# Patient Record
Sex: Female | Born: 1935 | Race: White | Hispanic: No | Smoking: Never smoker
Health system: Southern US, Community
[De-identification: ages and names within clinical notes are randomized; demographics above are authoritative.]

## PROBLEM LIST (undated history)

## (undated) DIAGNOSIS — E119 Type 2 diabetes mellitus without complications: Secondary | ICD-10-CM

## (undated) DIAGNOSIS — B9681 Helicobacter pylori [H. pylori] as the cause of diseases classified elsewhere: Secondary | ICD-10-CM

## (undated) DIAGNOSIS — I701 Atherosclerosis of renal artery: Secondary | ICD-10-CM

## (undated) DIAGNOSIS — K760 Fatty (change of) liver, not elsewhere classified: Secondary | ICD-10-CM

## (undated) DIAGNOSIS — R0600 Dyspnea, unspecified: Secondary | ICD-10-CM

## (undated) DIAGNOSIS — M8580 Other specified disorders of bone density and structure, unspecified site: Secondary | ICD-10-CM

## (undated) DIAGNOSIS — K2970 Gastritis, unspecified, without bleeding: Secondary | ICD-10-CM

## (undated) DIAGNOSIS — J449 Chronic obstructive pulmonary disease, unspecified: Secondary | ICD-10-CM

## (undated) DIAGNOSIS — I519 Heart disease, unspecified: Secondary | ICD-10-CM

## (undated) DIAGNOSIS — Z860101 Personal history of adenomatous and serrated colon polyps: Secondary | ICD-10-CM

## (undated) DIAGNOSIS — K219 Gastro-esophageal reflux disease without esophagitis: Secondary | ICD-10-CM

## (undated) DIAGNOSIS — R600 Localized edema: Secondary | ICD-10-CM

## (undated) DIAGNOSIS — Z86010 Personal history of colonic polyps: Secondary | ICD-10-CM

## (undated) DIAGNOSIS — E039 Hypothyroidism, unspecified: Secondary | ICD-10-CM

## (undated) DIAGNOSIS — Z800 Family history of malignant neoplasm of digestive organs: Secondary | ICD-10-CM

## (undated) DIAGNOSIS — I10 Essential (primary) hypertension: Secondary | ICD-10-CM

## (undated) DIAGNOSIS — R918 Other nonspecific abnormal finding of lung field: Secondary | ICD-10-CM

## (undated) DIAGNOSIS — M1990 Unspecified osteoarthritis, unspecified site: Secondary | ICD-10-CM

## (undated) DIAGNOSIS — K5730 Diverticulosis of large intestine without perforation or abscess without bleeding: Secondary | ICD-10-CM

## (undated) DIAGNOSIS — E7800 Pure hypercholesterolemia, unspecified: Secondary | ICD-10-CM

## (undated) DIAGNOSIS — K834 Spasm of sphincter of Oddi: Secondary | ICD-10-CM

## (undated) DIAGNOSIS — K3184 Gastroparesis: Secondary | ICD-10-CM

## (undated) DIAGNOSIS — R079 Chest pain, unspecified: Secondary | ICD-10-CM

## (undated) DIAGNOSIS — K589 Irritable bowel syndrome without diarrhea: Secondary | ICD-10-CM

## (undated) DIAGNOSIS — E049 Nontoxic goiter, unspecified: Secondary | ICD-10-CM

## (undated) HISTORY — DX: Gastritis, unspecified, without bleeding: K29.70

## (undated) HISTORY — DX: Hypothyroidism, unspecified: E03.9

## (undated) HISTORY — DX: Irritable bowel syndrome, unspecified: K58.9

## (undated) HISTORY — DX: Nontoxic goiter, unspecified: E04.9

## (undated) HISTORY — DX: Gastroparesis: K31.84

## (undated) HISTORY — DX: Essential (primary) hypertension: I10

## (undated) HISTORY — DX: Dyspnea, unspecified: R06.00

## (undated) HISTORY — DX: Unspecified osteoarthritis, unspecified site: M19.90

## (undated) HISTORY — DX: Type 2 diabetes mellitus without complications: E11.9

## (undated) HISTORY — DX: Pure hypercholesterolemia, unspecified: E78.00

## (undated) HISTORY — DX: Helicobacter pylori (H. pylori) as the cause of diseases classified elsewhere: B96.81

## (undated) HISTORY — DX: Family history of malignant neoplasm of digestive organs: Z80.0

## (undated) HISTORY — PX: PARTIAL HYSTERECTOMY: SHX80

## (undated) HISTORY — DX: Chronic obstructive pulmonary disease, unspecified: J44.9

## (undated) HISTORY — DX: Localized edema: R60.0

## (undated) HISTORY — PX: HIP SURGERY: SHX245

## (undated) HISTORY — PX: ERCP W/ SPHINCTEROTOMY AND BALLOON DILATION: SHX1524

## (undated) HISTORY — PX: TUBAL LIGATION: SHX77

## (undated) HISTORY — PX: ABDOMINAL HERNIA REPAIR: SHX539

## (undated) HISTORY — DX: Atherosclerosis of renal artery: I70.1

## (undated) HISTORY — DX: Other nonspecific abnormal finding of lung field: R91.8

## (undated) HISTORY — DX: Chest pain, unspecified: R07.9

## (undated) HISTORY — DX: Fatty (change of) liver, not elsewhere classified: K76.0

## (undated) HISTORY — DX: Personal history of adenomatous and serrated colon polyps: Z86.0101

## (undated) HISTORY — DX: Diverticulosis of large intestine without perforation or abscess without bleeding: K57.30

## (undated) HISTORY — DX: Gastro-esophageal reflux disease without esophagitis: K21.9

## (undated) HISTORY — DX: Personal history of colonic polyps: Z86.010

## (undated) HISTORY — DX: Other specified disorders of bone density and structure, unspecified site: M85.80

## (undated) HISTORY — DX: Heart disease, unspecified: I51.9

## (undated) HISTORY — DX: Spasm of sphincter of Oddi: K83.4

## (undated) HISTORY — PX: CHOLECYSTECTOMY: SHX55

---

## 2001-03-29 ENCOUNTER — Ambulatory Visit (HOSPITAL_COMMUNITY): Admission: RE | Admit: 2001-03-29 | Discharge: 2001-03-29 | Payer: Self-pay | Admitting: Family Medicine

## 2001-03-29 ENCOUNTER — Encounter: Payer: Self-pay | Admitting: Family Medicine

## 2001-04-23 ENCOUNTER — Encounter: Payer: Self-pay | Admitting: General Surgery

## 2001-04-27 ENCOUNTER — Inpatient Hospital Stay (HOSPITAL_COMMUNITY): Admission: RE | Admit: 2001-04-27 | Discharge: 2001-05-02 | Payer: Self-pay | Admitting: General Surgery

## 2002-01-27 ENCOUNTER — Ambulatory Visit (HOSPITAL_COMMUNITY): Admission: RE | Admit: 2002-01-27 | Discharge: 2002-01-27 | Payer: Self-pay | Admitting: Internal Medicine

## 2002-02-24 ENCOUNTER — Encounter: Payer: Self-pay | Admitting: Family Medicine

## 2002-02-24 ENCOUNTER — Ambulatory Visit (HOSPITAL_COMMUNITY): Admission: RE | Admit: 2002-02-24 | Discharge: 2002-02-24 | Payer: Self-pay | Admitting: Family Medicine

## 2002-03-28 ENCOUNTER — Ambulatory Visit (HOSPITAL_COMMUNITY): Admission: RE | Admit: 2002-03-28 | Discharge: 2002-03-28 | Payer: Self-pay | Admitting: Cardiovascular Disease

## 2002-03-28 ENCOUNTER — Encounter: Payer: Self-pay | Admitting: Cardiovascular Disease

## 2002-11-18 ENCOUNTER — Encounter (INDEPENDENT_AMBULATORY_CARE_PROVIDER_SITE_OTHER): Payer: Self-pay | Admitting: Internal Medicine

## 2002-11-18 ENCOUNTER — Ambulatory Visit (HOSPITAL_COMMUNITY): Admission: RE | Admit: 2002-11-18 | Discharge: 2002-11-18 | Payer: Self-pay

## 2002-12-16 ENCOUNTER — Ambulatory Visit (HOSPITAL_COMMUNITY): Admission: RE | Admit: 2002-12-16 | Discharge: 2002-12-16 | Payer: Self-pay | Admitting: Internal Medicine

## 2002-12-16 ENCOUNTER — Encounter (INDEPENDENT_AMBULATORY_CARE_PROVIDER_SITE_OTHER): Payer: Self-pay | Admitting: Internal Medicine

## 2003-11-18 DIAGNOSIS — K3184 Gastroparesis: Secondary | ICD-10-CM

## 2003-11-18 HISTORY — DX: Gastroparesis: K31.84

## 2003-12-28 ENCOUNTER — Ambulatory Visit (HOSPITAL_COMMUNITY): Admission: RE | Admit: 2003-12-28 | Discharge: 2003-12-28 | Payer: Self-pay | Admitting: Family Medicine

## 2004-06-14 ENCOUNTER — Ambulatory Visit (HOSPITAL_COMMUNITY): Admission: RE | Admit: 2004-06-14 | Discharge: 2004-06-14 | Payer: Self-pay | Admitting: Internal Medicine

## 2004-07-18 ENCOUNTER — Ambulatory Visit (HOSPITAL_COMMUNITY): Admission: RE | Admit: 2004-07-18 | Discharge: 2004-07-18 | Payer: Self-pay | Admitting: Internal Medicine

## 2004-08-29 ENCOUNTER — Ambulatory Visit (HOSPITAL_COMMUNITY): Admission: RE | Admit: 2004-08-29 | Discharge: 2004-08-29 | Payer: Self-pay | Admitting: Internal Medicine

## 2004-09-04 ENCOUNTER — Ambulatory Visit (HOSPITAL_COMMUNITY): Admission: RE | Admit: 2004-09-04 | Discharge: 2004-09-04 | Payer: Self-pay | Admitting: Internal Medicine

## 2004-10-14 ENCOUNTER — Ambulatory Visit: Payer: Self-pay | Admitting: Internal Medicine

## 2004-11-10 ENCOUNTER — Emergency Department (HOSPITAL_COMMUNITY): Admission: EM | Admit: 2004-11-10 | Discharge: 2004-11-10 | Payer: Self-pay | Admitting: Emergency Medicine

## 2004-11-14 ENCOUNTER — Ambulatory Visit: Payer: Self-pay | Admitting: Internal Medicine

## 2004-11-26 ENCOUNTER — Ambulatory Visit (HOSPITAL_COMMUNITY): Admission: RE | Admit: 2004-11-26 | Discharge: 2004-11-26 | Payer: Self-pay | Admitting: Internal Medicine

## 2004-12-12 ENCOUNTER — Ambulatory Visit: Payer: Self-pay | Admitting: Internal Medicine

## 2005-01-27 ENCOUNTER — Ambulatory Visit: Payer: Self-pay | Admitting: Internal Medicine

## 2005-01-27 ENCOUNTER — Ambulatory Visit (HOSPITAL_COMMUNITY): Admission: RE | Admit: 2005-01-27 | Discharge: 2005-01-27 | Payer: Self-pay | Admitting: Internal Medicine

## 2005-05-27 ENCOUNTER — Ambulatory Visit (HOSPITAL_COMMUNITY): Admission: RE | Admit: 2005-05-27 | Discharge: 2005-05-27 | Payer: Self-pay | Admitting: General Surgery

## 2006-06-19 ENCOUNTER — Ambulatory Visit (HOSPITAL_COMMUNITY): Admission: RE | Admit: 2006-06-19 | Discharge: 2006-06-19 | Payer: Self-pay | Admitting: Pediatrics

## 2006-09-25 ENCOUNTER — Ambulatory Visit: Payer: Self-pay | Admitting: Gastroenterology

## 2006-09-28 ENCOUNTER — Ambulatory Visit (HOSPITAL_COMMUNITY): Admission: RE | Admit: 2006-09-28 | Discharge: 2006-09-28 | Payer: Self-pay | Admitting: Internal Medicine

## 2006-10-21 ENCOUNTER — Ambulatory Visit: Payer: Self-pay | Admitting: Internal Medicine

## 2007-05-25 ENCOUNTER — Encounter: Admission: RE | Admit: 2007-05-25 | Discharge: 2007-05-25 | Payer: Self-pay | Admitting: Cardiovascular Disease

## 2007-05-28 HISTORY — PX: CARDIAC CATHETERIZATION: SHX172

## 2007-09-21 ENCOUNTER — Ambulatory Visit: Payer: Self-pay | Admitting: Specialist

## 2007-10-26 ENCOUNTER — Ambulatory Visit (HOSPITAL_COMMUNITY): Admission: RE | Admit: 2007-10-26 | Discharge: 2007-10-26 | Payer: Self-pay | Admitting: Endocrinology

## 2008-04-27 ENCOUNTER — Ambulatory Visit: Payer: Self-pay | Admitting: Internal Medicine

## 2008-05-01 ENCOUNTER — Ambulatory Visit (HOSPITAL_COMMUNITY): Admission: RE | Admit: 2008-05-01 | Discharge: 2008-05-01 | Payer: Self-pay | Admitting: Internal Medicine

## 2008-05-29 ENCOUNTER — Encounter: Payer: Self-pay | Admitting: Internal Medicine

## 2008-05-29 ENCOUNTER — Ambulatory Visit: Payer: Self-pay | Admitting: Internal Medicine

## 2008-05-29 ENCOUNTER — Ambulatory Visit (HOSPITAL_COMMUNITY): Admission: RE | Admit: 2008-05-29 | Discharge: 2008-05-29 | Payer: Self-pay | Admitting: Internal Medicine

## 2008-05-29 HISTORY — PX: COLONOSCOPY: SHX174

## 2008-06-29 ENCOUNTER — Ambulatory Visit: Payer: Self-pay | Admitting: Internal Medicine

## 2008-06-30 ENCOUNTER — Ambulatory Visit (HOSPITAL_COMMUNITY): Admission: RE | Admit: 2008-06-30 | Discharge: 2008-06-30 | Payer: Self-pay | Admitting: Internal Medicine

## 2009-02-06 DIAGNOSIS — K219 Gastro-esophageal reflux disease without esophagitis: Secondary | ICD-10-CM | POA: Insufficient documentation

## 2009-02-06 DIAGNOSIS — E039 Hypothyroidism, unspecified: Secondary | ICD-10-CM | POA: Insufficient documentation

## 2009-02-06 DIAGNOSIS — K589 Irritable bowel syndrome without diarrhea: Secondary | ICD-10-CM

## 2009-02-06 DIAGNOSIS — E119 Type 2 diabetes mellitus without complications: Secondary | ICD-10-CM

## 2009-02-06 DIAGNOSIS — I10 Essential (primary) hypertension: Secondary | ICD-10-CM | POA: Insufficient documentation

## 2009-02-06 DIAGNOSIS — E7800 Pure hypercholesterolemia, unspecified: Secondary | ICD-10-CM | POA: Insufficient documentation

## 2009-02-06 DIAGNOSIS — E049 Nontoxic goiter, unspecified: Secondary | ICD-10-CM | POA: Insufficient documentation

## 2009-02-06 DIAGNOSIS — J45909 Unspecified asthma, uncomplicated: Secondary | ICD-10-CM | POA: Insufficient documentation

## 2009-02-06 DIAGNOSIS — K3184 Gastroparesis: Secondary | ICD-10-CM | POA: Insufficient documentation

## 2009-02-06 DIAGNOSIS — M1990 Unspecified osteoarthritis, unspecified site: Secondary | ICD-10-CM | POA: Insufficient documentation

## 2009-02-06 DIAGNOSIS — I519 Heart disease, unspecified: Secondary | ICD-10-CM

## 2009-02-06 DIAGNOSIS — M949 Disorder of cartilage, unspecified: Secondary | ICD-10-CM

## 2009-02-06 DIAGNOSIS — J449 Chronic obstructive pulmonary disease, unspecified: Secondary | ICD-10-CM

## 2009-02-06 DIAGNOSIS — J4489 Other specified chronic obstructive pulmonary disease: Secondary | ICD-10-CM | POA: Insufficient documentation

## 2009-02-06 DIAGNOSIS — M899 Disorder of bone, unspecified: Secondary | ICD-10-CM | POA: Insufficient documentation

## 2009-02-07 ENCOUNTER — Ambulatory Visit: Payer: Self-pay | Admitting: Internal Medicine

## 2009-02-07 DIAGNOSIS — Z8719 Personal history of other diseases of the digestive system: Secondary | ICD-10-CM

## 2009-02-07 DIAGNOSIS — K834 Spasm of sphincter of Oddi: Secondary | ICD-10-CM | POA: Insufficient documentation

## 2009-02-07 DIAGNOSIS — K5730 Diverticulosis of large intestine without perforation or abscess without bleeding: Secondary | ICD-10-CM | POA: Insufficient documentation

## 2009-02-07 DIAGNOSIS — K7689 Other specified diseases of liver: Secondary | ICD-10-CM | POA: Insufficient documentation

## 2009-02-07 DIAGNOSIS — B372 Candidiasis of skin and nail: Secondary | ICD-10-CM

## 2009-02-13 ENCOUNTER — Encounter: Payer: Self-pay | Admitting: Internal Medicine

## 2009-03-09 ENCOUNTER — Telehealth: Payer: Self-pay | Admitting: Gastroenterology

## 2009-03-12 ENCOUNTER — Ambulatory Visit (HOSPITAL_COMMUNITY): Admission: RE | Admit: 2009-03-12 | Discharge: 2009-03-12 | Payer: Self-pay | Admitting: Otolaryngology

## 2009-03-12 ENCOUNTER — Encounter: Payer: Self-pay | Admitting: Internal Medicine

## 2009-08-09 ENCOUNTER — Ambulatory Visit (HOSPITAL_COMMUNITY): Admission: RE | Admit: 2009-08-09 | Discharge: 2009-08-09 | Payer: Self-pay | Admitting: Internal Medicine

## 2009-08-14 ENCOUNTER — Encounter (INDEPENDENT_AMBULATORY_CARE_PROVIDER_SITE_OTHER): Payer: Self-pay

## 2009-09-07 ENCOUNTER — Encounter: Payer: Self-pay | Admitting: Internal Medicine

## 2009-09-07 LAB — CONVERTED CEMR LAB
ALT: 16 units/L (ref 0–35)
AST: 14 units/L (ref 0–37)
Albumin: 4.4 g/dL (ref 3.5–5.2)
Alkaline Phosphatase: 52 units/L (ref 39–117)
Bilirubin, Direct: 0.1 mg/dL (ref 0.0–0.3)
Indirect Bilirubin: 0.4 mg/dL (ref 0.0–0.9)
Total Bilirubin: 0.5 mg/dL (ref 0.3–1.2)
Total Protein: 7 g/dL (ref 6.0–8.3)

## 2009-09-20 ENCOUNTER — Ambulatory Visit: Payer: Self-pay | Admitting: Internal Medicine

## 2009-09-21 DIAGNOSIS — Z860100 Personal history of colon polyps, unspecified: Secondary | ICD-10-CM | POA: Insufficient documentation

## 2010-08-13 ENCOUNTER — Ambulatory Visit (HOSPITAL_COMMUNITY): Admission: RE | Admit: 2010-08-13 | Discharge: 2010-08-13 | Payer: Self-pay | Admitting: Otolaryngology

## 2010-10-14 ENCOUNTER — Ambulatory Visit (HOSPITAL_COMMUNITY): Admission: RE | Admit: 2010-10-14 | Discharge: 2010-10-14 | Payer: Self-pay | Admitting: Internal Medicine

## 2010-12-19 NOTE — Assessment & Plan Note (Signed)
Summary: FU OV 6 MO WITH RMR PER [facility]SL,FATTY LIVER/AMS   Visit Type:  Follow-up Visit Primary Care Provider:  zach hall  Chief Complaint:  follow up- doing good.  History of Present Illness: 77 year old lady with ongoing constipation diarrhea consistent with irritable bowel syndrome. Fatty liver/ gastroesophageal  reflux disease. History of colonic adenoma; positive family history colon cancer.  Recent LFTs came back completely normal.  She's lost 12 pounds since she was last seen in August of last year. She does have some fungal irritation around her anus when she has diarrhea.  She does take lactulose for constipation.  She has been taking Questran on a p.r.n. basis for diarrhea; reflux symptoms well controlled on Prevacid. She will be due for a surveillance colonoscopy in 2014.  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 Take One and Half Tablet Every Day 2)  Metaglip 2.5-250 Mg Tabs (Glipizide-Metformin Hcl) .... One in The Morning, One At [facility] and Take 2 At Supper. 3)  Furosemide 40 Mg Tabs (Furosemide) .... As Directed 4)  Lisinopril-Hydrochlorothiazide 20-12.5 Mg Tabs  (Lisinopril-Hydrochlorothiazide) .... Two Times A Day 5)  Mycolog 2 Cream .... As Needed 6)  Ultracet 37.5-325 Mg Tabs (Tramadol-Acetaminophen) .... Take Every 4-6 Hours As Needed 7)  Proventil Hfa 108 (90 Base) Mcg/act Aers (Albuterol Sulfate) .... As Needed 8)  Singulair 10 Mg Tabs (Montelukast Sodium) .... Once Daily 9)  Clarinex 5 Mg Tabs (Desloratadine) .... Once Daily 10)  Detrol La 4 Mg Xr24h-Cap (Tolterodine Tartrate) .... Once Daily 11)  Imdur 30 Mg Xr24h-Tab (Isosorbide Mononitrate) .... Once Daily 12)  Synthroid 50 Mcg Tabs (Levothyroxine Sodium) .... Once Daily 13)  Duoneb 0.5-2.5 (3) Mg/81ml Soln (Ipratropium-Albuterol) .... Every 6 Hours As Needed 14)  Nitroquick .... As Needed 15)  Lactulose .... As Needed 16)  Vitamin D 50,000iu .... One  By Mouth Q Week 17)  Questran Light 4 Gm Pack (Cholestyramine Light) .... One By Mouth Daily As Needed Diarrhea 18)  Nystatin 100000 Unit/gm Crea (Nystatin) .... Apply 3 Times A Day To Affected Area Prn 19)  Prevacid 30 Mg Cpdr (Lansoprazole) .... Once Daily  Allergies (verified): 1)  ! Morphine 2)  ! 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.5) 3)  Diverticulosis of Colon  (ICD-562.10) 4)  Helicobacter Pylori Gastritis, Hx of  (ICD-V12.79) 5)  Gastroparesis  (ICD-536.3) 6)  Unspecified Heart Disease  (ICD-429.9) 7)  Osteoarthritis  (ICD-715.90) 8)  Ibs  (ICD-564.1) 9)  Osteopenia  (ICD-733.90) 10)  Gerd  (ICD-530.81) 11)  Hx of  Goiter  (ICD-240.9) 12)  Hypothyroidism  (ICD-244.9) 13)  COPD  (ICD-496) 14)  Asthma  (ICD-493.90) 15)  Hypercholesterolemia  (ICD-272.0) 16)  Hypertension  (ICD-401.9) 17)  Diabetes Mellitus  (ICD-250.00)  Past Surgical History: Last updated: 02/07/2009 TUBAL LIGATION PARTIAL HYSTERECTOMY CHOLECYSTECTOMY 3 ABDOMINAL HERNIA  REPAIRS HISTORY OF LEFT HIP SURGERY FOR A LARGE LIPOMA HISTORY OR ERCP IN JANUARY 2005. WITH SPHINCTEROTOMY  Family History: Last updated: 02/07/2009 Family History of Colon Cancer: Brother  Social History: Last updated: 02/07/2009 Marital Status: Married Children:  Occupation: Retired Alcohol Use - no Denies tobacco use  Vital Signs:  Patient profile:   77 year old female Height:      61 inches Weight:      208 pounds BMI:     39.44 Temp:     98.1 degrees F oral Pulse rate:   76 / minute BP sitting:   124 / 78  (left arm) Cuff size:   regular  Vitals Entered By: Hendricks Limes LPN (September 20, 2009 8:36 AM)  Physical Exam  General:  pleasant alert conversant acute distress skin warm and dry no jaundice no kidney stigmata of chronic liver disease Eyes:  no scleral icterus conjunctival are pink Lungs:  clear to auscultation Heart:  regular rate and rhythm without murmur gallop rub obese positive bowel sounds soft nontender without appreciable mass or organomegaly Impression & Recommendations: Fatty liver with no evidence of cirrhosis. Normal LFTs now. She was was commended on her weight loss.  Recommendations: Continue weight loss/ aerobic exercise as feasible; repeat hepatic profile with office visit one year Alternating  constipation diarrhea Most consistent with IBS.Marland Kitchen Lactulose may be contributing to her perianal irritation. Recommend stopping lactulose and use MiraLax 17 g orally daily prn.  She's on Questran p.r.n. fashion.  I've asked her to stop using Questran and start using ImodiumAD as needed diarrhea.  At her request, I have written her a new prescription for Mycolog cream Gastroesophageal reflux disease well controlled on Prevacid she is to continue that regimen  Appended Document: Orders Update-charge    Clinical Lists Changes  Problems: Added new problem of COLONIC POLYPS, HX OF (ICD-V12.72) Orders: Added new Service order of Est. Patient Level III  (57846) - Signed

## 2011-01-07 DIAGNOSIS — R600 Localized edema: Secondary | ICD-10-CM

## 2011-01-07 HISTORY — DX: Localized edema: R60.0

## 2011-01-30 LAB — CREATININE, SERUM
Creatinine, Ser: 0.65 mg/dL (ref 0.4–1.2)
GFR calc Af Amer: >60 mL/min (ref >60)
GFR calc non Af Amer: >60 mL/min (ref >60)

## 2011-02-26 LAB — CREATININE, SERUM
Creatinine, Ser: 0.7 mg/dL (ref 0.4–1.2)
GFR calc Af Amer: >60 mL/min (ref >60)
GFR calc non Af Amer: >60 mL/min (ref >60)

## 2011-04-01 NOTE — Op Note (Signed)
[name], [name]              ACCOUNT NO.:  [id number]   MEDICAL RECORD NO.:  [id number]          PATIENT TYPE:  AMB   LOCATION:  DAY                           FACILITY:  [facility]   PHYSICIAN:  R. Roetta Sessions, M.D. DATE OF BIRTH:  [birth year]   DATE OF PROCEDURE:  05/29/2008  DATE OF DISCHARGE:                               OPERATIVE REPORT   INDICATIONS FOR PROCEDURE:  A 77 year old lady with a history of colonic  adenomas and positive family of history of colon cancer who has had some  intermittent low-volume hematochezia, history of chronic abdominal pain  suspected to be secondary to irritable bowel syndrome, and history of  pulmonary nodules for which a followup CT was done recently which  demonstrated stability since 2006.  Dr. Alver Fisher felt this was benign  process, no further evaluation warranted.  Colonoscopy is now being  done.  This approach was discussed with the patient at length.  Risks,  benefits, and alternatives have been reviewed and questions answered.  She is agreeable.  Please see documentation in the medical record.   PROCEDURE NOTE:  O2 saturation, blood pressure, pulse, and respirations  monitored throughout the entire procedure.  Conscious sedation, Versed 4  mg IV and Demerol 100 mg IV in divided doses.   INSTRUMENT:  Pentax video chip system.   FINDINGS:  Digital rectal exam revealed no abnormalities.  Endoscopic  findings:  The prep was adequate.  Colon:  Colonic mucosa was surveyed  from the rectosigmoid junction through the left transverse, right colon,  appendiceal orifice, ileocecal valve, and cecum.  These structures were  well seen and photographed for the record.  Terminal ileum was intubated  to 10 cm.  From this level, scope was slowly and cautiously withdrawn.  All previous mentioned mucosal surfaces were again seen.  The patient  had pancolonic diverticula.  The patient had a lipoma on the fold of mid  ascending colon.  Please see photos and a  fatty-appearing ileocecal  valve.  The terminal ileum appeared normal.  The patient did have  pancolonic diverticula and a single diminutive polyp in the mid  descending colon which was cold biopsied/removed.  Remainder of the  colonic mucosa appeared normal.  The scope was pulled down to the rectum  with examination of rectal mucosa including retroflexed view of the anal  verge demonstrated no abnormalities.  Anal canal demonstrated a second 4-  mm polyp and 5-cm anal verge and friable anal canal.  This polyp also  was cold biopsied/removed.  Remainder of the rectal mucosa appeared  normal.  The patient tolerated the procedure well and was reactive in  endoscopy.   IMPRESSION:  1. Friable anal canal otherwise an diminutive rectal polyp (one)      status post cold biopsy, otherwise normal rectum.  2. Pancolonic diverticula.  Diminutive descending colon polyp status      post cold biopsy.  Remainder of the colonic mucosa and terminal      mucosa appeared normal except for a lipoma in the ascending      segment.   RECOMMENDATIONS:  1. Diverticulosis  and hemorrhoid literature was provided to Ms.      Desantiago.  A 10-day course of Anusol-HC suppositories one per rectum      at bedtime.  2. Daily fiber supplement with Metamucil, or Citrucel.  3. Follow up on path.  4. Further recommendations to follow.      Jonathon Bellows, M.D.  Electronically Signed     RMR/MEDQ  D:  05/29/2008  T:  05/29/2008  Job:  [id number]   cc:   Alan Mulder, M.D.  Fax: [phone]

## 2011-04-01 NOTE — Assessment & Plan Note (Signed)
NAMEALIVYA, Angela [name]               CHART#:  [id number]   DATE:  06/29/2008                       DOB:  07-04-1933   History of colonic adenomas, positive family history of colon cancer.  A  colonoscopy on 05/29/2008 demonstrated a hyperplastic polyps, removed.  She also had pancolonic diverticula.  She is back wanting recheck and  refill of her medications.  She has been on cholestyramine 4 g daily and  Levbid previously.  She has occasional diarrhea.  In fact, when she was  here in June, she was prescribed lactulose for constipation.  However,  most of the time, she has diarrhea and she is out of Questran and wants  to get it refilled.  She is no longer taking lactulose.  She also has  some right upper quadrant pain from time to time, history of a fatty  liver, and sphincter of Oddi dysfunction, status post sphincterotomy  previously.  She has stable pulmonary nodules on chest CT.  No further  evaluations felt to be warranted.  She has a history of fatty liver.  Also, complains of a bulge from time to time along her incision line  from her prior surgery.   CURRENT MEDICATIONS:  See updated list.   ALLERGIES:  Morphine.   PHYSICAL EXAMINATION:  GENERAL:  She appears in no acute distress.  VITAL SIGNS:  Weight 220, height 5 feet 2 inches, temp 98, BP 122/70,  and pulse 60.  SKIN:  Warm and dry.  CHEST:  Lungs are clear to auscultation.  ABDOMEN:  Obese.  Well-healed midline scar, did not appreciate hernia.  __________ is nontender.  Abdomen is soft.  Minimal right upper quadrant  tenderness.  No organomegaly or mass.  EXTREMITIES:  Trace lower extremity edema.   ASSESSMENT:  The patient is a pleasant 77 year old lady with alternating  constipation and diarrhea with prominence of diarrhea, history of  adenomatous polyps, but only hyperplastic lesions found on recent  colonoscopy.  She has vague right upper quadrant abdominal pain.  She is  status post cholecystectomy.  History of  a fatty liver on ultrasound  with normal LFTs previously.   RECOMMENDATIONS:  1. Repeat colonoscopy 5 years.  2. Discussed at length to resume on lactulose and previous treatment      with Levbid/cholestyramine.  We will allow her to go back on      cholestyramine at a dose of 2 g daily to see how that works for      this lady.  If that is not acceptable in controlling her diarrhea      symptoms, she can increase to 4 g daily and only then will we add      Levbid, but I told her if she starts to get constipated, will need      to back off the cholestyramine.  No lactulose for the time being.      Right upper quadrant abdominal pain is quite nonspecific.  We will      go ahead      and recheck hepatic ultrasound and obtain LFTs today.  Further      recommendations to follow.       Jonathon Bellows, M.D.  Electronically Signed     RMR/MEDQ  D:  06/29/2008  T:  06/30/2008  Job:  [id number]

## 2011-04-01 NOTE — H&P (Signed)
NAME:  FELISSA, BLOUCH              ACCOUNT NO.:  [id number]   MEDICAL RECORD NO.:  [id number]           PATIENT TYPE:  AMB   LOCATION:  DAY                           FACILITY:  [facility]   PHYSICIAN:  R. Roetta Sessions, M.D. DATE OF BIRTH:  Aug 21, 1933   DATE OF ADMISSION:  04/27/2008  DATE OF DISCHARGE:  LH                              HISTORY & PHYSICAL   CHIEF COMPLAINTS:  Abdominal pain.   HISTORY OF PRESENT ILLNESS:  Ms. Munce is a 77 year old Caucasian  female well known to our practice from prior evaluations.  She has a  history of chronic right upper quadrant abdominal pain, GERD, fatty  liver, IBS, and colonic polyps.  She also has a history of gastroparesis  but unable to tolerate Reglan in the past.  I saw her at time of her  husband's appointment recently.  She stated it was time for her  colonoscopy.  When I spoke with her, she was having lots of issues,  therefore, we brought her in for an office visit.   She states she has ongoing right upper quadrant abdominal pain which is  essentially unchanged.  There is no postprandial component.  It is  mostly when she moves or turns over on bed.  It is stable and has been  there for years.  She has had quite a bit of intermittent left-sided  abdominal pain.  It is not daily.  When it occurs, it  may linger all  day and she describes it as a nagging pain.  Sometimes related to meals.  She continues to have alternating constipation and diarrhea.  She never  knows how her day is going to be as far as her bowel movements.  She  does have intermittent bright red blood per rectum.  She says her stool  comes out brown in color, but she sees red blood in the water in the  toilet.  She denies any nausea or vomiting.  Her heartburn is controlled  on Prevacid b.i.d..  Denies any dysphagia or odynophagia.  Denies fever  or chills.  She also has a history of abnormal chest CT and Dr. Dionicia Abler  had been following previously.  Her last chest CT was in  July 2006.  There was no significant interval change in the well circumscribed  densities in the right lower lobe.  There is also a vague density in the  anterior aspect of the left lower lobe, unchanged maximum diameter of 6  mm.  Followup CT in 2 years was recommended.   CURRENT MEDICATIONS:  DuoNeb every 6 hours as needed, Synthroid 50 mcg  daily,  NitroQuick p.r.n., Imdur 30 mg daily, Detrol LA 4 mg daily,  Clarinex 5 mg daily, Singulair 10 mg daily, Proventil inhaler p.r.n.,  Ultracet p.r.n., Prevacid 30 mg b.i.d., Mycolog-II cream p.r.n.,  lisinopril/HCTZ 25/12.5 mg b.i.d., Lasix 40 mg daily, Metaglip 2.5/250  mg one in the morning,  one in lunch, and two in supper,  Zetia 10 mg  daily, and Toprol-XL 25 mg 1-1/2 tablet daily.   ALLERGIES:  MORPHINE   PAST MEDICAL HISTORY:  1. Diabetes.  2. Hypertension.  3. Hypercholesterolemia.  4. Asthma/COPD.  5. Hypothyroidism.  6. History of goiter.  7. Gastroesophageal reflux disease.  8. Osteopenia.  9. IBS.  10.Osteoarthritis.  11.Heart disease, which she really does not have any specific details.  12.Gastroparesis, last colonoscopy was March 2006.  She had pancolonic      diverticulosis, 3 polyps, one from the cecum, and 2 from the      rectum, were hyperplastic.  She does have a history of adenomatous      polyps previously.  Last EGD July 2005, with mild changes of reflux      esophagitis, small hiatal hernia.  Esophagus was dilated, 56-French      Maloney dilator.  She had a tubal ligation, partial hysterectomy,      cholecystectomy, three abdominal hernia repairs, and history of      left hip surgery for a large lipoma.  She has a history of ERCP in      January 2005, with sphincterotomy, dilated common bile duct 12 mm      suspected to have sphincter of Oddi dysfunction.  History of H. pylori gastritis treated back in 1996.   FAMILY HISTORY:  Mother had gangrene.  Father died with lung cancer.  A  sister died with MI and  one with bladder cancer, and a brother with lung  cancer.   SOCIAL HISTORY:  She is married, retired, nonsmoker, and no alcohol use.   REVIEW OF SYSTEMS:  GI:  See HPI for GI.  CONSTITUTIONAL:  She has had a  25-pound weight loss in the last 1-1/2 years, unintentional.  CARDIOPULMONARY:  No chest pain.  Chronic dyspnea on exertion.  GENITOURINARY:  No dysuria or hematuria.   PHYSICAL EXAMINATION:  VITAL SIGNS:  Weight 217, height 5 feet 2 inches,  temperature 98.3, blood pressure 124/78, and pulse 88.  GENERAL:  Pleasant obese Caucasian female, in no acute distress.  SKIN:  Warm and dry.  No jaundice.  HEENT:  Sclerae nonicteric.  Oropharyngeal mucosa moist and pink.  No  lesions, erythema, or exudate.  No lymphadenopathy or thyromegaly.  CHEST:  Lungs are clear to auscultation.  CARDIAC:  Exam reveals regular rate and rhythm.  Normal S1 and S2.  No  murmurs, rubs, or gallops.  ABDOMEN:  Positive bowel sounds.  Abdomen is soft.  She has minimal  tenderness in the right upper quadrant region.  She has mild tenderness  in the left lower quadrant.  No rebound or guarding.  No organomegaly or  masses.  No abdominal bruits.  Well-healed incision in midline.  No  obvious recurrent hernias.  EXTREMITIES:  Lower extremities, no edema.   IMPRESSION:  Ms. Santino is a 77 year old lady with history of chronic  alternating constipation, diarrhea, and left-sided abdominal pain,  suspect related to irritable bowel syndrome.  She does have diffuse  colonic diverticulosis but clinically, no diverticulitis at this time.  She continues to have intermittent hematochezia.  She has a history of  adenomatous polyps and family history of colorectal cancer with last  colonoscopy over 3 years ago.  We would recommend one at this time.  In  addition, she has a history of abnormal chest CT and is due for  surveillance.  We will also straight this as we have been following this  through our practice.  She is  well aware of this, again is abnormal or  if there has been any significant change, we would be referring  her to  pulmonologist.  I discussed risks, alternatives, and benefits with  regards to colonoscopy with the patient, and she is agreeable to  proceed.   PLAN:  1. Chest CT.  2. C-MET and CBC.  3. Colonoscopy with Dr. Jena Gauss.  4. Lactulose 15 to 30 mL every night p.r.n. constipation, #1, x1      refill.  5. She has had a refill for Mycolog which she uses on occasion for      perianal rash related to drought diarrhea.  Nystatin topical cream      60 g tube apply 3 times a day as needed, 1 refill.      Tana Coast, P.AJonathon Bellows, M.D.  Electronically Signed    LL/MEDQ  D:  04/27/2008  T:  04/28/2008  Job:  [id number]   cc:   Alan Mulder, M.D.  Fax: [phone]

## 2011-04-04 NOTE — Op Note (Signed)
NAME:  Angela [name], Angela [name]                        ACCOUNT NO.:  [id number]   MEDICAL RECORD NO.:  [id number]                   PATIENT TYPE:  AMB   LOCATION:  DAY                                  FACILITY:  [facility]   PHYSICIAN:  Lionel December, M.D.                 DATE OF BIRTH:  1933-07-12   DATE OF PROCEDURE:  12/16/2002  DATE OF DISCHARGE:                                 OPERATIVE REPORT   PROCEDURE:  Endoscopic retrograde cholangiopancreatography with endoscopic  sphincterotomy.   ENDOSCOPIST:  Lionel December, M.D.   INDICATIONS FOR PROCEDURE:  This patient is a 77 year old Caucasian female  with recurrent right upper quadrant pain reminding her of the pain that she  had prior to cholecystectomy.  She had upper abdominal ultrasound.  Her bile  duct was dilated to 12 mm.  This also suggested a common duct stone.  LFTs  were normal.  Given these findings it was felt that she needed to have  diagnostic and therapeutic ERCP.  The procedure and risks were reviewed with  the patient and informed consent was obtained.  She was given antimicrobial  prophylaxis with ampicillin and Cipro.   PREOPERATIVE MEDICATIONS:  Cetacaine spray for oropharyngeal topical  anesthesia.  Demerol 50 mg IV, Versed 10 mg IV and Glucagon 1 mg IV in  divided doses.   FINDINGS:  Procedure performed in radiology department.  Dr. Gordan Payment  assisted in the help with fluoroscopy.  The patient was placed in the prone  position. Vital signs and O2 saturations were monitored during the procedure  and remained stable.  A therapeutic Olympus video duodenoscope was passed  via the oropharynx into the esophagus and stomach, and across the pylorus  into the bulb and descending duodenum.  There was a large diverticulum and  ampullary orifices located along the left lateral wall.  The bile duct was  cannulated with autotome.   The pancreatic duct was initially cannulated and sort of a dilute contrast.  There were no  filling defects or mucosal abnormalities.  Bile duct was then  selectively cannulated using the same catheter and guide wire.  The bile  duct was markedly dilated measuring 13-14 mm at maximal diameter at about  mid-duct.  Intrahepatic biliary radicles were normal.  Distally they  tapered, but there were no filling defects to suggest stone. It appeared not  to be draining well.  Therefore, sphincterotomy was performed with gush of  contrast and bile.  Endoscope was then withdrawn.  The patient tolerated the  procedure well.   FINAL DIAGNOSES:  1. Normal appearing ampulla located on the left lateral wall of a large     duodenal diverticulum.  I suspect that the diverticulum is the source of     ultrasound findings.  2. Markedly dilated CBD and CHD without filling defects. Sphincterotomy is     performed as sphincter of Oddi dysfunction suspected.  RECOMMENDATIONS:  1. We will get a delayed film.  2. She will be going home later today.  3. She will return for OV in 3-4 weeks.                                               Lionel December, M.D.    NR/MEDQ  D:  12/16/2002  T:  12/16/2002  Job:  [id number]   cc:   Mila Homer. Sudie Bailey, M.D.  [address] [place], [place] [address]  Fax: [phone]

## 2011-04-04 NOTE — Op Note (Signed)
NAMEMARIAPAULA, [name]              ACCOUNT NO.:  [id number]   MEDICAL RECORD NO.:  [id number]          PATIENT TYPE:  AMB   LOCATION:  DAY                           FACILITY:  [facility]   PHYSICIAN:  Lionel December, M.D.    DATE OF BIRTH:  03-Mar-1933   DATE OF PROCEDURE:  01/27/2005  DATE OF DISCHARGE:                                 OPERATIVE REPORT   PROCEDURE:  Total colonoscopy.   INDICATIONS:  Angela [name] is a 77 year old Caucasian female who is here for  colonic polyps as well as positive family history of colon carcinoma in a  brother who is here for surveillance exam.  Her last exam was three years  ago with removal of one adenoma. On a prior exam six years ago, she had  three adenomas removed.  Six years ago she had three polyps removed.  Procedure was reviewed with the patient and informed consent was obtained.   PREOPERATIVE MEDICATIONS:  Demerol 25 mg IV, Versed 6 mg IV in divided  doses.   FINDINGS:  The procedure was performed in the endoscopy suite.  The  patient's vital signs and O2 saturation were monitored during the procedure  and remained stable.  The patient was placed in the left lateral recumbent  position.  Rectal examination was performed.  No abnormality noted on  external or digital exam.  Olympus video scope was placed in the rectum and  advanced under vision into the sigmoid colon and beyond.  Preparation was  satisfactory.  Scattered diverticula were noted throughout the colon.  Most  of these were in the sigmoid colon.  The scope was passed to the cecum which  was identified by appendiceal orifice and ileocecal valve. There was a small  bilobed polyp across from the ileocecal valve which was ablated by cold  biopsy.  As the scope was withdrawn, colonic mucosa was examined for  a  second time.  There were two more small polyps at the rectum which was  obliterated by cold biopsy and submitted in one container. The scope was  retroflexed to examine the anorectal  junction which was unremarkable.  Endoscope was straightened and withdrawn.  The patient tolerated the  procedure well.   FINAL DIAGNOSIS:  1.  Pancolonic diverticulosis.  More so the diverticula noted in the sigmoid      colon.  2.  Three polyps ablated by cold biopsy, one from the cecum and two from the      rectum.   RECOMMENDATIONS:  She will resume her usual medications, diet and fiber  supplement.   I will be contacting the patient with the biopsy results and further  recommendations.  Given today's findings, I feel that she could wait five  years before her next exam.     NR/MEDQ  D:  01/27/2005  T:  01/27/2005  Job:  [id number]

## 2011-04-04 NOTE — Cardiovascular Report (Signed)
[place]. [facility]  Patient:    DESEREA, BORDLEY Visit Number: [id number] MRN: [id number]          Service Type: CAT Location: [facility] 2874 01 Attending Physician:  Virgina Evener Dictated by:   Lennette Bihari, M.D. Admit Date:  03/28/2002 Discharge Date: 03/28/2002   CC:         John Giovanni, M.D.  Orville Govern, c/o Kurt[facility]   Cardiac Catheterization  INDICATIONS:  Ms. Teri Legacy is a 77 year old white female with a history of hypertension, type 2 diabetes mellitus, hyperlipidemia, and positive family history of coronary artery disease.  Catheterization in 1998 revealed fairly normal coronary arteries.  The patient had recently experienced episodes of chest pain.  A recent Persantine Cardiolite study suggested the possibility of an LAD ischemia; she is now referred for definitive diagnostic cardiac catheterization.  DESCRIPTION OF PROCEDURE:  After premedication with Valium 5 mg intravenously, the patient was prepped and draped in the usual fashion.  The right femoral artery was punctured anteriorly and a 5-French arterial sheath was inserted without difficulty.  Diagnostic catheterization was done with Judkins 4- and 5-French left and right coronary catheters as well as a 5-French pigtail catheter.  Distal aortography was also performed due to the hypertensive history to make certain there was no renovascular etiology or significant abdominal aortoiliac disease.  Hemostasis was obtained by direct manual pressure.  HEMODYNAMIC DATA:  Central aortic pressure was 168/79; left ventricular pressure 168/15; pulse A wave 23.  ANGIOGRAPHIC DATA:  The left main coronary artery was angiographically normal and bifurcated into an LAD and left circumflex system.  The LAD gave rise to two major diagonal vessels.  The mid-LAD seemed to dip intramyocardially and there was a component of mild mid-systolic  muscle bridging with a 20% narrowing in this region.  The circumflex vessel gave rise to a high marginal, intermediate-like vessel and then one additional circumflex marginal vessel and was angiographically normal.  The right coronary artery had mild 10-20% ostial tapering and was otherwise angiographically normal and gave rise to a PDA and posterolateral system.  Biplane cine ventriculography revealed normal LV function without focal segmental wall motion abnormalities.  Distal aortography did not demonstrate any renal artery stenosis.  There was no significant aortoiliac disease.  IMPRESSION: 1. Normal left ventricular function. 2. Essentially normal coronary arteries.  There is a suggestion of mild    mid-systolic bridging of the mid left anterior descending without    significant obstructive disease.  There is also mild 10-20% smooth ostial    proximal narrowing of the right coronary artery.  RECOMMENDATION:  Medical therapy with more optimal blood pressure control. Dictated by:   Lennette Bihari, M.D. Attending Physician:  Virgina Evener DD:  03/28/02 TD:  03/30/02 Job: [id number]

## 2011-04-04 NOTE — H&P (Signed)
NAME:  Angela [name], Angela [name]                  ACCOUNT NO.:  [id number]   MEDICAL RECORD NO.:  [id number]                    PATIENT TYPE:   LOCATION:                                       FACILITY:   PHYSICIAN:  Lionel December, M.D.                 DATE OF BIRTH:  [birth year]   DATE OF ADMISSION:  DATE OF DISCHARGE:                                HISTORY & PHYSICAL   PRESENTING COMPLAINT:  Epigastric pain, hematemesis.   HISTORY:  The patient is a 77 year old Caucasian female patient of Dr.  Sudie Bailey who is here for a scheduled visit.  She is well-known to me from  previous evaluation.  She was last seen in March this year when she had  colonoscopy reviewed below.  She called our office about five weeks ago with  complaints of upset stomach, nausea, vomiting.  This appointment was made,  the patient tells me that she also had an episode of hematemesis, and that  was the reason she called, she apparently forgot to tell us, or it was not  written on the message.  At any rate, she has not had any more hematemesis.  She also denies melena.  She continues to have sharp pain in her epigastrium  which radiates to the right below the rib cage.  At times it reminds her of  gallbladder attack, but she has had prior cholecystectomy.  She also  complains of pain in her left upper quadrant which is associated with  irregular bowel movements.  She previously has been diagnosed with irritable  bowel syndrome.  She does notice significant relief with medication.  Her  appetite is fair.  She denies rectal bleeding.  She has not lost any weight  recently.  She also has some intermittent heartburn and regurgitation.   REVIEW OF SYSTEMS:  Positive for retrosternal pain which previously has been  diagnosed to be musculoskeletal pain.  She is on ibuprofen.  Previously she  was on _______.   MEDICATIONS:  She is on Prevacid 30 mg b.i.d., Lisinopril, HCTZ 20/12.5  q.d., Norvasc 10 mg q.d., Amaryl 2 mg  q.d., Avandia 8 mg q.d., albuterol  inhaler 2 puffs q.i.d. p.r.n., Singulair 10 mg q.d., ______ 10 mg q.d.  p.r.n., Lasix 40 mg every other day, NitroQuick 0.4 mg p.r.n., Ventolin  nebulizer t.i.d. p.r.n., ibuprofen 800 mg usually once a day, Temazepam 30  mg q.h.s., Lopid 1 q.a.m.   PAST MEDICAL HISTORY:  She has multiple medical problems which include  hypertension, emphysema, asthma, non-insulin dependent diabetes mellitus,  osteoporosis or osteopenia.  She also has been diagnosed with IBS.  She has  osteoarthrosis and chronic reflux esophagitis.  Her last EGD was in July  2000, which showed erosive/ulcerative reflux esophagitis and a small sliding  hiatal hernia.  Prior EGD of March 1996 showed more or less similar changes.  She was treated for H. Pylori gastritis in 1996.  She also has  a history of  colonic polyps.  Her last colonoscopy was in March this year.  She had three  polyps removed, one was an adenoma and the other polyps were inflammatory.  She has had tubal ligation, hemorrhoidectomy, hysterectomy.  She had large  lipoma excised from her left hip thigh area.  She is status post  cholecystectomy.  She has had skin cancers sites from the left scapular  area.   ALLERGIES:  PRILOSEC, MORPHINE.  Prilosec resulted in pruritus.   FAMILY HISTORY:  Significant for colon carcinoma in a brother.   SOCIAL HISTORY:  She is retired.  She is married.  She has never smoked  cigarettes and does not drink alcohol.  She has five children.   PHYSICAL EXAMINATION:  GENERAL:  Attractive, pleasant, moderately obese  Caucasian female who is in no acute distress.  VITAL SIGNS:  Weight 246 pounds, height 5 feet 4 inches, pulse 82 per  minute, blood pressure 128/88, afebrile.  HEENT:  Conjunctivae is pink.  Sclerae nonicteric.  Oropharyngeal mucosa is  normal.  A few teeth are missing, the remaining in satisfactory condition.  She does have partial upper plate, which is at home.  NECK:   Without masses or thyromegaly.  CARDIAC:  Regular rhythm, normal S1, S2.  No murmur or gallop noticed.  LUNGS:  Clear to auscultation.  ABDOMEN:  Obese, bowel sounds are normal.  Palpation reveals mild to  moderate tenderness in the epigastrium and mild tenderness at LUQ and RUQ.  No organomegaly or masses noted.  RECTAL:  Deferred.  EXTREMITIES:  She has at least 1+ pitting edema involving both her legs.  She does not have clubbing.   ASSESSMENT:  1. The patient has recurrent epigastric pain.  She had an episode     hematemesis a few weeks ago.  She is on nonsteroidal anti-inflammatories.     She therefore could have peptic ulcer disease or her pain and hematemesis     could be secondary to ongoing esophagitis despite therapy.  2. Left-sided abdominal pain with irregular bowel movements.  She has both     diarrhea and constipation, but she is not on bowel forming agent as has     been recommended.  3. History of colonic polyps and positive history of colon carcinoma.  She     is status post TCF in March of this year, and will not be due for one     until about four years from now.   RECOMMENDATIONS:  1. She will continue the present therapy.  I have asked her to take Citrucel     1 tablespoonful daily.  2. Diagnostic esophagogastroduodenoscopy to be performed at [facility] in the near future.  I have reviewed the procedure for the     patient and she is agreeable.  Further recommendations will be made at     the time of endoscopy.  3. The patient was also given a prescription for Mycolog II cream to be used     for perianal discomfort usually associated with diarrhea spells.                                               Lionel December, M.D.    NR/MEDQ  D:  11/14/2002  T:  11/14/2002  Job:  [id number]

## 2011-04-04 NOTE — H&P (Signed)
Angela [name]                        ACCOUNT NO.:  [id number]   MEDICAL RECORD NO.:  [id number]                   PATIENT TYPE:  OUT   LOCATION:  RAD                                  FACILITY:  [facility]   PHYSICIAN:  Lionel December, M.D.                 DATE OF BIRTH:  07-Jun-1933   DATE OF ADMISSION:  12/28/2003  DATE OF DISCHARGE:  12/28/2003                                HISTORY & PHYSICAL   PRESENTING COMPLAINT:  1. Dysphagia.  2. Right upper quadrant pain.   HISTORY OF PRESENT ILLNESS:  Angela [name] is a 77 year old Caucasian female,  patient of Dr. Sudie Bailey who presents with recurrent right upper quadrant  pain, and also complaining of dysphagia.  As far as pain is concerned, it is  experienced when she is sitting in certain positions.  At times, she feels  like she has a lump in her right upper quadrant.  She has had biliary tract  problems and has undergone sphincterotomy in the past, and she wants to make  sure the problem has not recurred.  She gets her LFT's periodically through  Dr. Landry Dyke office, and/or Dr. Michelle Nasuti office, and they have been normal  most recently.  She had LFT's on October 24, 2003, and these were within  normal limits.  She has not experienced any nausea or vomiting with this  pain.  Over the weekend, she did have an episode of nausea and feels she had  self-limiting gastroenteritis.  She states the pain on the left side is well-  controlled with therapy.  She still has periods of diarrhea, and/or  constipation.  She recently was constipated and had good results with  lactulose which was given to her by a friend.  She would like to have a  prescription in hand just in case.  She also complains of dysphagia which  she has had off and on for the past couple of months.  She has difficulties  with solids as well as stringy foods.  She feels it gets stuck at the level  of the suprasternal area.  She has a history of reflux, and no symptoms  otherwise.   She states her heartburn is well controlled as long as she stays  on her medication.  Her last EGD was in January of 2004.  Her esophagus is  normal, and it was not dilated.  She has a fair appetite.  She has lost 9  pounds since she went on Synthroid about six months ago.   MEDICATIONS:  1. She is on Prevacid 30 mg b.i.d.  2. Lisinopril HCT 20/12.5 daily.  3. Levbid one tablet b.i.d.  4. Albuterol inhaler two puffs q.i.d. p.r.n.  5. Singulair 10 mg daily.  6. Clarinex 10 mg daily.  7. Ventolin nebulizer q.i.d. p.r.n.  8. NitroQuick 0.4 mg sublingual p.r.n.  9. Temazepam 30 mg q.h.s.  10.      Lovastatin  20 mg daily.  11.      Ultracet one daily.  12.      Isosorbide 30 mg daily.  13.      Metaglip 2.5/500 q.i.d.  14.      Synthroid 50 micrograms daily.   PAST MEDICAL HISTORY:  She has multiple medical problems which include  hypertension, emphysema, bronchial asthma, noninsulin dependent diabetes  mellitus, osteopenia, irritable bowel syndrome, osteoarthritis, chronic  reflux esophagitis.  She has had erosive, ulcerative reflux esophagitis and  a small sliding hiatal hernia on EGD of July 2000.  EGD in January of 2004  revealed small sliding hiatal hernia, nonerosive antral gastritis.  She was  treated for H pylori gastritis back in 1996.  She also has a history of  colonic polyps.  Her most recent colonoscopy was in March of 2000.  She had  three polyps removed, and one was an adenoma.  Prior colonoscopy was in  2003, and she had one adenoma removed.  She has had tubal ligation,  hemorrhoidectomy, hysterectomy, and she had a large lipoma removed from her  left hip.  She is status post cholecystectomy.  She had ERCP in January of  2004 with sphincterotomy.  She had right upper quadrant pain, dilated bowel  duct measuring 12 mm.  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  She had sphincterotomy with symptomatic relief.   ALLERGIES:  PRILOSEC AND MORPHINE  SULFATE.   PHYSICAL EXAMINATION:  Weight 223-1/2 pounds.  She is 5 feet, 4 inches tall.  Pulse 70 per minute.  Blood pressure 122/80.  She is afebrile.  HEENT:  Conjunctivae is pink.  Sclerae is nonicteric.  Oropharyngeal mucosa  is normal.  She has dentures in place.  NECK:  Without masses or thyromegaly.  CARDIAC:  Regular rhythm, normal S1, S2, no No murmurs rubs, or gallops.  ABDOMEN:  Bowel sounds are normal.  Palpation reveals soft abdomen with mild  tenderness below right costal margin.  This appears to be superficial.  No  organomegaly or masses.  RECTAL:  Examination is deferred.  She does not have peripheral edema.   ASSESSMENT:  1. As far as her right upper quadrant pain is concerned, appears to be wall     or muscle pain.  She does not have __________.  It appears to be     different from the pain that she had prior to her sphincterotomy.  I do     not feel that this needs to be brought up.  She is getting periodic LFT's     through other offices.  2. Dysphagia.  She could have developed an esophageal ring.  She will     benefit from esophageal dilatation.  Her heart burn is well controlled     with PPI, but she has required a double dose.  3. Irritable bowel syndrome.  Intermittent constipation.  Her initial     presentation was diarrhea.   RECOMMENDATION:  1. Esophagogastroduodenoscopy.  Esophageal dilatation will be performed in     five or six weeks from now.  2. She will continue her Prevacid and Levbid as before.  3. Prescription given for lactulose one to two tablespoonful q.h.s. p.r.n.     with multiple refills for up to a year.  4. She will undergo a surveillance colonoscopy next year.     ___________________________________________  Lionel December, M.D.   NR/MEDQ  D:  04/23/2004  T:  04/24/2004  Job:  [id number]   cc:   Mila Homer. Sudie Bailey, M.D.  [address] [place], [place] [address]  Fax: [phone]   [facility]

## 2011-04-04 NOTE — H&P (Signed)
Angela [name], Angela [name]                          ACCOUNT NO.:  [id number]   MEDICAL RECORD NO.:  [id number]                    PATIENT TYPE:   LOCATION:                                       FACILITY:   PHYSICIAN:  Lionel December, M.D.                 DATE OF BIRTH:  1933-06-10   DATE OF ADMISSION:  DATE OF DISCHARGE:                                HISTORY & PHYSICAL   PRESENTING COMPLAINT:  Recurrent epigastric and right upper quadrant pain,  chest congestion.   SUBJECTIVE:  The patient is a 77 year old Caucasian female patient of Dr.  Michelle Nasuti who was last seen on November 14, 2002 because of recurrent  epigastric pain and hematemesis.  At that time, she also complained of right  upper quadrant pain occurring intermittently and reminding her of pain that  she used to have prior to cholecystectomy.  She underwent  esophagogastroduodenoscopy on November 18, 2002.  She had small sliding hiatal  hernia and nonerosive antral gastritis.  I felt that her recent hematemesis  possibly was related to Mallory-Weiss tear.  She has been treated for H.  pylori infection in the past.  She was maintained on her PPI and asked her  to use ibuprofen sparingly.  She had upper abdominal ultrasound, which was  abnormal.  Her bile duct was dilated measuring 9-12 mm in diameter.  Intrahepatic ducts were also mildly dilated.  There was suggestion of a 9-mm  calculus in the mid duct.  I therefore asked the patient to have LFT's and  return for an office visit.  While she is not having more hematemesis, she  continues to experience intermittent upper abdominal pain, also on the  right.  Today, she is also complaining of chest congestion and coughing up  yellowish sputum.  She also has had some wheezing.  She denies fever or  chills.   MEDICATIONS:  1. Lisinopril/HCTZ 20/12.5 daily.  2. Norvasc 10 mg daily.  3. Amaryl 2 mg daily.  4. Avandia 8 mg daily.  5. Albuterol inhaler 2 puffs q.i.d. p.r.n.  6.  Singulair 10 mg daily.  7. Claritin 10 mg daily p.r.n.  8. Lasix 120 mg daily.  9. NitroQuick 0.4 mg p.r.n.  10.      Ventolin nebulizer t.i.d. p.r.n.  11.      Ibuprofen 800 mg p.r.n. which she does not take daily.  12.      Temazepam 30 mg q.h.s.  13.      Levbid 0.375 mg daily.   PAST MEDICAL HISTORY:  Past medical history is reviewed in my note of  November 14, 2002 and will not be repeated.   ALLERGIES:  1. PRILOSEC.  2. MORPHINE.   OBJECTIVE:  VITAL SIGNS:  Weight 242 pounds, pulse 82 per minute, blood  pressure 128/80.  She is afebrile.  HEENT:  Conjunctiva is pink.  Sclera is non-icteric.  NECK:  No adenopathy or thyromegaly.  CARDIAC:  Regular rhythm.  Normal S1 and S2.  LUNGS:  Auscultation of the lungs reveal vesicular breath sounds with  scattered expiratory rhonchi.  ABDOMEN:  Full, soft, with mild tenderness at LLQ, as well as epigastrium  and below the right costal margin.  No organomegaly or masses.   LABORATORY DATA:  Her labs from November 22, 2002:  Bilirubin is 0.5, AP 58,  AST 21, ALT 22, albumin 4.0.   ASSESSMENT:  1. Dilated bile duct.  Ultrasound is also suspicious for     choledocholithiasis.  LFT's are normal.  If indeed she has a common duct     stone, she will need endoscopic retrograde cholangiopancreatography with     sphincterotomy and stone extraction.  Final decision will be deferred     until I have had a chance to review her ultrasound.  2. She has acute bronchitis.  She needs to be treated with antibiotics.   PLAN:  Ultrasound will be reviewed and decision made whether she should have  a magnetic resonance cholangiopancreatography or if we should directly  proceed with endoscopic retrograde cholangiopancreatography.   Augmentin 875 mg p.o. b.i.d. for 10 days, Tussionex liquid 1 teaspoonful  t.i.d. p.r.n.  Prescription given for 8 ounces without refill.                                               Lionel December, M.D.    NR/MEDQ  D:   11/24/2002  T:  11/25/2002  Job:  [id number]   cc:   Mila Homer. Sudie Bailey, M.D.  [address] [place], [place] [address]  Fax: [phone]

## 2011-04-04 NOTE — Op Note (Signed)
NAME:  Angela [name], KOTLARZ                        ACCOUNT NO.:  [id number]   MEDICAL RECORD NO.:  [id number]                   PATIENT TYPE:  AMB   LOCATION:  DAY                                  FACILITY:  [facility]   PHYSICIAN:  Lionel December, M.D.                 DATE OF BIRTH:  01-17-1933   DATE OF PROCEDURE:  DATE OF DISCHARGE:                                 OPERATIVE REPORT   PROCEDURE:  Esophagogastroduodenoscopy.   ENDOSCOPIST:  Lionel December, M.D.   INDICATIONS:  This patient is a 77 year old Caucasian female with recurrent  epigastric pain who also had an episode of hematemesis.  She has chronic  GERD and is maintained on PPI but she is also on low-dose NSAID therapy.  Procedure and risks were reviewed with the patient and informed consent was  obtained.   PREOPERATIVE MEDICATIONS:  Cetacaine spray for oropharyngeal topical  anesthesia, Demerol 50 mg IV and Versed 4 mg IV in divided dose.   INSTRUMENT:  Olympus video system.   FINDINGS:  Procedure performed in endoscopy suite.  The patient's vital  signs and O2 saturation were monitored during the procedure and remained  stable.  The patient was placed in the left lateral recumbent position and  endoscope was passed via the oropharynx without any difficulty into the  esophagus.   ESOPHAGUS:  Mucosa of the esophagus was normal throughout.  Squamocolumnar  junction was unremarkable.  There was a small sliding hiatal hernia.  There  was no ring or stricture formation.   STOMACH:  It was empty and distended very well with insufflation.  The folds  of the proximal stomach were normal.  Examination of the mucosa revealed  patchy erythema at the antrum without erosions or ulcers.  The pyloric  channel was patent.  Angularis and fundus were examined by retroflexing the  scope and were normal.   DUODENUM:  Examination of the bulb and postbulbar duodenum was normal.   The endoscope was withdrawn.  The patient tolerated the  procedure well.   FINAL DIAGNOSIS:  Small sliding hiatal hernia and nonerosive antral  gastritis.  Please note that she has been treated for Helicobacter pylori  infection in the past.  Suspect hematemesis could have been related to  Mallory-Weiss tear which has completely healed.   RECOMMENDATIONS:  1. She will continue the antireflux measures with a PPI as before.  I would     like for her not to take ibuprofen or     take it on a p.r.n. basis.  2. Upper abdominal ultrasound will be obtained with attention to pancreas     and bile duct to further evaluate her epigastric pain.  Lionel December, M.D.    NR/MEDQ  D:  11/18/2002  T:  11/18/2002  Job:  [id number]   cc:   Mila Homer. Sudie [place], M.D.  [address] [place], [place] [address]  Fax: [phone]

## 2011-04-04 NOTE — Op Note (Signed)
NAME:  Angela [name], Angela [name]                        ACCOUNT NO.:  [id number]   MEDICAL RECORD NO.:  [id number]                   PATIENT TYPE:  AMB   LOCATION:  DAY                                  FACILITY:  [facility]   PHYSICIAN:  Lionel December, M.D.                 DATE OF BIRTH:  08-11-1933   DATE OF PROCEDURE:  06/14/2004  DATE OF DISCHARGE:                                 OPERATIVE REPORT   PROCEDURE:  Esophagogastroduodenoscopy with esophageal dilatation.   INDICATION:  Angela [name] is a 77 year old, Caucasian female with multiple medical  problems including GERD, was on double-dose PPI, who continues to complain  of dysphagia.  She is undergoing diagnostic/therapeutic EGD.  Procedure is  reviewed with the patient.  Informed consent was obtained.   PREOPERATIVE MEDICATIONS:  1. Cetacaine spray for pharyngeal topical anesthesia.  2. Demerol 50 mg IV.  3. Versed 5 mg IV in divided dose.   FINDINGS:  Procedure performed in endoscopy suite.  The patient's vital  signs and O2 saturations were monitored during the procedure and remained  stable.  The patient was placed in left lateral decubitus position, and  Olympus video scope was passed via oropharynx without any difficulty into  esophagus.   ESOPHAGUS:  Mucosa of the esophagus was normal.  There was focal erythema at  GE junction, but there was no ring or stricture formation.  There was 3 cm  size sliding hiatal hernia.   STOMACH:  It had a large amount of food debris all the way from proximal  body to pylorus, and there was scant amount of food in the bulb.  The part  of the gastric mucosa that was seen was normal.  Angularis and fundus and  cardia were well seen and were normal.  Pyloric channel was patent.   DUODENUM:  Examination of the bulb and postbulbar duodenum was normal.  Endoscope was withdrawn.  The patient tolerated the procedure well.  Esophagus was dilated by passing 56 [place] Maloney dilator given history of  dysphagia.   Esophagus was reexamined.  There was no mucosal injury noted.  Endoscope was withdrawn.  The patient tolerated the procedure well.   FINAL DIAGNOSES:  1. Mild change of reflux esophagitis limited to gastroesophageal junction     and small sliding hiatal hernia but no evidence of ring or stricture.  2. Esophagus dilated given history of dysphagia by passing 56 [place] Maloney     dilator.   Large amount of food debris in the stomach with a patent pylorus indicative  of gastroparesis.   RECOMMENDATIONS:  1. Clear liquids for 2 days.  2. I would like for her to drop Levbid to 1/2 tab b.i.d. and if possible, I     would ask that she simply discontinue this     medication or use it on p.r.n. basis.  3. Reglan 10 mg a.c. and q.h.s., prescription given for __________ refills.  She was informed of potential side effects.  She will return for office     visit in 2 month from now.      ___________________________________________                                            Lionel December, M.D.   NR/MEDQ  D:  06/14/2004  T:  06/14/2004  Job:  [id number]   cc:   Mila Homer. Sudie Bailey, M.D.  [address] [place], [place] [address]  Fax: [phone]

## 2011-10-07 ENCOUNTER — Other Ambulatory Visit: Payer: Self-pay

## 2011-10-08 NOTE — Telephone Encounter (Signed)
Patient requested mycolog cream. Refused refill based on not seen in two years. May request from PCP or appt here if for GI symptoms.

## 2011-10-15 ENCOUNTER — Telehealth: Payer: Self-pay | Admitting: Internal Medicine

## 2011-10-15 NOTE — Telephone Encounter (Signed)
Patient is going to be seen

## 2011-10-16 ENCOUNTER — Ambulatory Visit (INDEPENDENT_AMBULATORY_CARE_PROVIDER_SITE_OTHER): Payer: Medicare Other | Admitting: Urgent Care

## 2011-10-16 ENCOUNTER — Encounter: Payer: Self-pay | Admitting: Urgent Care

## 2011-10-16 ENCOUNTER — Ambulatory Visit (HOSPITAL_COMMUNITY)
Admission: RE | Admit: 2011-10-16 | Discharge: 2011-10-16 | Disposition: A | Discharge status: Home / Self Care | Payer: Medicare Other | Source: Ambulatory Visit | Attending: Urgent Care | Admitting: Urgent Care

## 2011-10-16 ENCOUNTER — Ambulatory Visit (HOSPITAL_COMMUNITY): Payer: Medicare Other

## 2011-10-16 DIAGNOSIS — K219 Gastro-esophageal reflux disease without esophagitis: Secondary | ICD-10-CM

## 2011-10-16 DIAGNOSIS — K551 Chronic vascular disorders of intestine: Secondary | ICD-10-CM | POA: Insufficient documentation

## 2011-10-16 DIAGNOSIS — K5710 Diverticulosis of small intestine without perforation or abscess without bleeding: Secondary | ICD-10-CM | POA: Insufficient documentation

## 2011-10-16 DIAGNOSIS — N83209 Unspecified ovarian cyst, unspecified side: Secondary | ICD-10-CM | POA: Insufficient documentation

## 2011-10-16 DIAGNOSIS — K3184 Gastroparesis: Secondary | ICD-10-CM

## 2011-10-16 DIAGNOSIS — K589 Irritable bowel syndrome without diarrhea: Secondary | ICD-10-CM

## 2011-10-16 DIAGNOSIS — K7689 Other specified diseases of liver: Secondary | ICD-10-CM

## 2011-10-16 DIAGNOSIS — R109 Unspecified abdominal pain: Secondary | ICD-10-CM | POA: Insufficient documentation

## 2011-10-16 DIAGNOSIS — E119 Type 2 diabetes mellitus without complications: Secondary | ICD-10-CM | POA: Insufficient documentation

## 2011-10-16 DIAGNOSIS — I10 Essential (primary) hypertension: Secondary | ICD-10-CM | POA: Insufficient documentation

## 2011-10-16 MED ORDER — LANSOPRAZOLE 30 MG PO CPDR: 30.0000 mg | DELAYED_RELEASE_CAPSULE | Freq: Every day | ORAL | Status: AC

## 2011-10-16 MED ORDER — NYSTATIN-TRIAMCINOLONE 100000-0.1 UNIT/GM-% EX CREA: 1.0000 "application " | TOPICAL_CREAM | Freq: Two times a day (BID) | CUTANEOUS | Status: DC

## 2011-10-16 MED ORDER — IOHEXOL 300 MG/ML  SOLN
100.0000 mL | Freq: Once | INTRAMUSCULAR | Status: AC | PRN
  Administered 2011-10-16: 100 mL via INTRAVENOUS

## 2011-10-16 NOTE — Assessment & Plan Note (Signed)
Treatment options for her gastroparesis are significantly limited at this time. She is having little in the way of nausea and vomiting.

## 2011-10-16 NOTE — Progress Notes (Signed)
Primary Care Physician:  Dwana Melena, MD Primary Gastroenterologist:  Dr. Jena Gauss Cardiologist: Dr. Tresa Endo at [facility] Complaint  Patient presents with  . Abdominal Pain   HPI:  Angela [name] is a 77 y.o. female here for further evaluation of abdominal pain. She tells me she had an abnormal aorta and thought she had an aneurysm. She tells me she was sent over by Dr. Tresa Endo.  After further review of her medical records from Dr. Landry Dyke office, it appears she has no artery stenosis and recent duplex Doppler showed elevated velocities of the SMA and celiac artery consistent with greater than 60% diameter reduction. The findings were stable. Pt states she needs a PET scan.  Tellme she is followed by Dr Tresa Endo w/ q 90 day ultrasounds for her stenosis.    She tells me she sleeps on her back & c/o mid-abd pain every night.  She also has pain every time she eats. Pain 5/10.  Pain is described as a "sore place" that is intermittent, lasts for minutes.  7/10 on pain scale. Generally has a BM daily without rectal bleeding or melena.  Denies nausea or vomiting.  She takes prevacid 15 mg daily.  Denies dysphagia or odynophagia.  Wt down 20# in past 3 yrs.  Pt tells me she is eating healthy & eating a whole lot less than 3 yrs ago.    Past Medical History  Diagnosis Date  . GERD (gastroesophageal reflux disease)   . Family hx of colon cancer   . Sphincter of Oddi spasm     Status post ERCP with sphincterotomy for SOD versus papillary stenosis  . Diverticulosis of colon   . Helicobacter pylori gastritis     s/p treatment?  . Gastroparesis 2005    Abnormal GS  . Heart disease, unspecified   . Osteoarthritis   . IBS (irritable bowel syndrome)   . Osteopenia   . Goiter     Dr Emmit Pomfret  . Hypothyroid   . COPD (chronic obstructive pulmonary disease)   . Asthma   . Hypercholesterolemia   . HTN (hypertension)   . DM (diabetes mellitus)   . Fatty liver     Insetting of normal LFTs  .  History of adenomatous polyp of colon   . Renal artery stenosis   . Pulmonary nodules     Multiple, had previously been followed by CT, stable    Past Surgical History  Procedure Date  . Tubal ligation   . Partial hysterectomy   . Cholecystectomy   . Abdominal hernia repair     x 3  . Hip surgery     left  . Ercp w/ sphincterotomy and balloon dilation   . Colonoscopy 05/29/2008    Dr. Jena Gauss diverticulosis, hemorrhoids, 2 hyperplastic polyps    Current Outpatient Prescriptions  Medication Sig Dispense Refill  . albuterol (PROVENTIL HFA;VENTOLIN HFA) 108 (90 BASE) MCG/ACT inhaler Inhale 2 puffs into the lungs every 6 (six) hours as needed.        . furosemide (LASIX) 40 MG tablet Take 40 mg by mouth 2 (two) times daily.        Marland Kitchen glipiZIDE-metformin (METAGLIP) 5-500 MG per tablet Take 1 capsule by mouth Twice daily.      Marland Kitchen ibuprofen (ADVIL,MOTRIN) 800 MG tablet Take 800 mg by mouth every 8 (eight) hours as needed. Rare      . isosorbide mononitrate (IMDUR) 30 MG 24 hr tablet Take 30 mg by mouth daily.        Marland Kitchen  levothyroxine (SYNTHROID, LEVOTHROID) 75 MCG tablet Take 75 mcg by mouth daily.        Marland Kitchen lisinopril-hydrochlorothiazide (PRINZIDE,ZESTORETIC) 20-12.5 MG per tablet Take 1 tablet by mouth 2 (two) times daily.        . metoprolol succinate (TOPROL-XL) 25 MG 24 hr tablet Take 25 mg by mouth daily. 1/2 tablet daily       . montelukast (SINGULAIR) 10 MG tablet Take 10 mg by mouth at bedtime.        Marland Kitchen NITROSTAT 0.4 MG SL tablet as needed.      . nystatin-triamcinolone (MYCOLOG II) cream Apply 1 application topically 2 (two) times daily.  60 g  0  . tolterodine (DETROL LA) 4 MG 24 hr capsule Take 4 mg by mouth daily.        . traMADol (ULTRAM) 50 MG tablet Take 50 mg by mouth every 6 (six) hours as needed. Maximum dose= 8 tablets per day       . lansoprazole (PREVACID) 30 MG capsule Take 1 capsule (30 mg total) by mouth daily.  31 capsule  11    Allergies as of 10/16/2011 - Review  Complete 10/16/2011  Allergen Reaction Noted  . Esomeprazole magnesium  02/07/2009  . Morphine      Family History  Problem Relation Age of Onset  . Colon cancer Brother 18  . Lung cancer Brother   . Cancer Sister     bladder  . Lung cancer Father     History   Social History  . Marital Status: Married    Spouse Name: N/A    Number of Children: 6  . Years of Education: N/A   Occupational History  . retired, day care owner    Social History Main Topics  . Smoking status: Never Smoker   . Smokeless tobacco: Not on file  . Alcohol Use: No  . Drug Use: No  . Sexually Active: Not on file   Other Topics Concern  . Not on file   Social History Narrative  . No narrative on file    Review of Systems: Gen: Denies any fever, chills, sweats, anorexia, fatigue, weakness, malaise, weight loss, and sleep disorder CV: Denies chest pain, angina, palpitations, syncope, orthopnea, PND, peripheral edema, and claudication. Resp: Denies dyspnea at rest, dyspnea with exercise, cough, sputum, wheezing, coughing up blood, and pleurisy. GI: Denies vomiting blood, jaundice, and fecal incontinence.   Denies dysphagia or odynophagia. GU : Denies urinary burning, blood in urine, urinary frequency, urinary hesitancy, nocturnal urination, and urinary incontinence. MS: Denies joint pain, limitation of movement, and swelling, stiffness, low back pain, extremity pain. Denies muscle weakness, cramps, atrophy.  Derm: Denies rash, itching, dry skin, hives, moles, warts, or unhealing ulcers.  Psych: Denies depression, anxiety, memory loss, suicidal ideation, hallucinations, paranoia, and confusion. Heme: Denies bruising, bleeding, and enlarged lymph nodes.  Physical Exam: BP 140/80  Pulse 86  Temp(Src) 98.6 F (37 C) (Temporal)  Ht 5\' 1"  (1.549 m)  Wt 198 lb 12.8 oz (90.175 kg)  BMI 37.56 kg/m2 General:   Alert,  Well-developed,  obese, pleasant and cooperative in NAD Head:  Normocephalic and  atraumatic. Eyes:  Sclera clear, no icterus.   Conjunctiva pink. Ears:  Normal auditory acuity. Nose:  No deformity, discharge,  or lesions. Mouth:  No deformity or lesions, oropharynx pink & moist. Neck:  Supple; no masses or thyromegaly. Lungs:  Clear throughout to auscultation.   No wheezes, crackles, or rhonchi. No acute distress. Heart:  Regular rate and rhythm; no murmurs, clicks, rubs,  or gallops. Abdomen:  Soft and nondistended. She has tenderness to her epigastrium around her umbilicus and her right upper and mid abdomen on superficial and deep palpation. No masses, hepatosplenomegaly or hernias noted. Normal bowel sounds, without guarding, and without rebound.   Rectal:  Deferred.   Msk:  Symmetrical without gross deformities. Normal posture. Pulses:  Normal pulses noted. Extremities:  1+ pretibial edema bilat Neurologic:  Alert and  oriented x4;  grossly normal neurologically. Skin:  Intact without significant lesions or rashes. Cervical Nodes:  No significant cervical adenopathy. Psych:  Alert and cooperative. Normal mood and affect.

## 2011-10-16 NOTE — Assessment & Plan Note (Addendum)
At her baseline. Denies any problems with constipation or diarrhea due to IBS currently.

## 2011-10-16 NOTE — Patient Instructions (Addendum)
To ER if you have severe abdominal pain Stop Prevacid 15 mg daily Begin Prevacid 30 mg daily Go get your labs today We will call with CT results

## 2011-10-16 NOTE — Progress Notes (Signed)
Cc to PCP 

## 2011-10-16 NOTE — Assessment & Plan Note (Addendum)
Angela [name] is a 77 y.o. female with chronic abdominal pain. Most recent dopplers through Dr. Michel Harrow office showed velocities suggestive of 60% or greater reduction of diameter of SMA and celiac artery. Her symptoms are postprandial & typical of mesenteric ischemia, and we do need to rule out other causes of abdominal pain including urinary tract infection,pancreatitis, diverticulitis.  CT of abdomen and pelvis with IV and oral contrast if her creatinine is normal. I have asked radiology to get a good look at her mesentery. To ER if you have severe abdominal pain UA,CBC, CMP, CBC, amylase,lipase

## 2011-10-16 NOTE — Assessment & Plan Note (Signed)
Possibly contributing to abdominal pain. Stop Prevacid 50 mg daily and increase to Prevacid 30 mg daily

## 2011-10-16 NOTE — Assessment & Plan Note (Signed)
Will recheck LFTs and have good visualization of the liver on CT.

## 2011-10-17 ENCOUNTER — Telehealth: Payer: Self-pay | Admitting: Gastroenterology

## 2011-10-17 LAB — COMPREHENSIVE METABOLIC PANEL
ALT: 11 U/L (ref 0–35)
Albumin: 4.7 g/dL (ref 3.5–5.2)
Alkaline Phosphatase: 56 U/L (ref 39–117)
CO2: 32 mEq/L (ref 19–32)
Glucose, Bld: 147 mg/dL — ABNORMAL HIGH (ref 70–99)
Potassium: 4.1 mEq/L (ref 3.5–5.3)
Sodium: 143 mEq/L (ref 135–145)
Total Protein: 6.8 g/dL (ref 6.0–8.3)

## 2011-10-17 LAB — CBC WITH DIFFERENTIAL/PLATELET
Basophils Relative: 1 % (ref 0–1)
Hemoglobin: 14.2 g/dL (ref 12.0–15.0)
Lymphs Abs: 1.4 10*3/uL (ref 0.7–4.0)
Monocytes Relative: 10 % (ref 3–12)
Neutro Abs: 4 10*3/uL (ref 1.7–7.7)
Neutrophils Relative %: 66 % (ref 43–77)
Platelets: 230 10*3/uL (ref 150–400)
RBC: 4.9 MIL/uL (ref 3.87–5.11)
WBC: 6.1 10*3/uL (ref 4.0–10.5)

## 2011-10-17 LAB — URINALYSIS W MICROSCOPIC + REFLEX CULTURE
Bacteria, UA: NONE SEEN
Casts: NONE SEEN
Glucose, UA: NEGATIVE mg/dL
Hgb urine dipstick: NEGATIVE
Ketones, ur: NEGATIVE mg/dL
Protein, ur: NEGATIVE mg/dL

## 2011-10-17 LAB — AMYLASE: Amylase: 43 U/L (ref 0–105)

## 2011-10-17 NOTE — Telephone Encounter (Signed)
KJ spoke with RMR regarding pts abnormal CT findings- He has recommended referral to [facility]- Doristine Church has spoken to pt and she is aware of the plan- will await call from me regarding appt-

## 2011-10-18 LAB — URINE CULTURE: Colony Count: 8000

## 2011-10-20 NOTE — Progress Notes (Signed)
Quick Note:  Patient aware Cc: HALL,ZACK, MD  ______

## 2011-10-21 ENCOUNTER — Telehealth: Payer: Self-pay | Admitting: Gastroenterology

## 2011-10-21 ENCOUNTER — Other Ambulatory Visit: Payer: Self-pay | Admitting: Gastroenterology

## 2011-10-21 NOTE — Telephone Encounter (Signed)
Called [facility], spoke w/ Tammy- put in referral order for pt to be scheduled- they will contact pt w/ appt and notify us when this is done

## 2011-10-21 NOTE — Progress Notes (Signed)
Results Cc to PCP  

## 2011-10-22 NOTE — Telephone Encounter (Signed)
Noted  

## 2011-10-24 ENCOUNTER — Ambulatory Visit: Payer: Self-pay | Admitting: Urgent Care

## 2011-10-24 ENCOUNTER — Telehealth: Payer: Self-pay | Admitting: Gastroenterology

## 2011-10-24 NOTE — Telephone Encounter (Signed)
Per Crystal pt wants to discuss w/ cardiologist first

## 2011-10-24 NOTE — Telephone Encounter (Signed)
Received a call from Tammy at [facility], at the request of the pt she contacted the pts cardiologist Dr Nicholaus Bloom, they are going to see the pt and decide if the stents can be placed by a Dr Allyson Sabal

## 2011-10-31 NOTE — Telephone Encounter (Signed)
Okay let's make FU OV after her cardiology appointment to discuss further.

## 2011-12-02 ENCOUNTER — Encounter: Payer: Self-pay | Admitting: Internal Medicine

## 2011-12-19 ENCOUNTER — Encounter: Payer: Self-pay | Admitting: Internal Medicine

## 2011-12-19 ENCOUNTER — Ambulatory Visit (INDEPENDENT_AMBULATORY_CARE_PROVIDER_SITE_OTHER): Payer: Medicare Other | Admitting: Internal Medicine

## 2011-12-19 DIAGNOSIS — R109 Unspecified abdominal pain: Secondary | ICD-10-CM

## 2011-12-19 DIAGNOSIS — J069 Acute upper respiratory infection, unspecified: Secondary | ICD-10-CM

## 2011-12-19 NOTE — Patient Instructions (Addendum)
Ultrasound of right upper quadrant to check liver and bile ducts  Dexilant 60 mg daily for GERD. Samples provided. Stop current PPI  Z-Pak as directed; followup with Dr. Margo Aye if sore gums and throat do not improve with these measures.   lipase and hepatic blood work to be drawn.   Plan for followup colonoscopy 2014 - history of colonic polyps

## 2011-12-21 ENCOUNTER — Encounter: Payer: Self-pay | Admitting: Internal Medicine

## 2011-12-21 NOTE — Assessment & Plan Note (Addendum)
Currently, abdominal pain not consistent with mesenteric ischemia. She does have ongoing complaints of abdominal pain, however which are fairly nonspecific.  Had documented a 2 pound weight loss since her last visit. Mesenteric arterial stenosis followed closely by [facility] heart and vascular. Apparently, no intervention felt to be warranted at this time ( I agree with that approach).  May be having some breakthrough reflux symptoms on Prevacid.  Gastroparesis symptoms clinically silent at this time. Symptoms consistent with a URI.  Ultrasound of right upper quadrant to check liver and bile ducts further  Dexilant 60 mg daily for GERD. Samples provided. Stop current PPI  Z-Pak as directed; followup with Dr. Margo Aye if sore gums and throat do not improve with these measures.  lipase and hepatic blood work to be drawn.   Plan for followup colonoscopy 2014 - history of colonic polyps  Office followup here in 6 weeks for weight check, etc.

## 2011-12-21 NOTE — Progress Notes (Signed)
Primary Care Physician:  Dwana Melena, MD, MD Primary Gastroenterologist:  Dr.   Pre-Procedure History & Physical: HPI:  Angela [name] is a 78 y.o. female here for followup of abdominal pain. SMA and celiac stenosis noted on CT a previously. Intervention recommended by the radiologist. She was referred back to Dr. Tresa Endo and associates. Apparently it was felt that intervention of mesenteric vascular stenosis not warranted at this time. Patient has done OK  from a GI standpoint. Has chronic abdominal pain.  Chronic gastroesophageal reflux.. Abdominal discomfort not necessarily postprandially. Not affected by bowel movement.. Some nausea no vomiting. No change in bowel function. Abdominal pain does not keep her from eating.   Some congestion recently with sore throat and sore gums. Wants me to prescribe a Z-Pak. Positive family history of colon cancer and a personal history of colonic adenoma. Due for some his colonoscopy 2014. Gallbladder is out. History of gastroparesis. History of papillary stenosis possible common duct stone and not confirmed at the time of prior ERCP with sphincterotomy. Of note, her biliary tree not dilated a recent CT. Weight is down to 2 pounds since her last visit. She reports having a AAA and aortic stenosis, however, apparently not critical enough, at this point in time, to warrant surgical intervention.  Labs througt her office recently okay except elevated blood sugar 147. UA with poor growth positive leukocytes. This was referred by back to Dr. Margo Aye.  History fatty liver but liver appeared normal a recent CT. Past Medical History  Diagnosis Date  . GERD (gastroesophageal reflux disease)   . Family hx of colon cancer   . Sphincter of Oddi spasm     Status post ERCP with sphincterotomy for SOD versus papillary stenosis  . Diverticulosis of colon   . Helicobacter pylori gastritis     s/p treatment?  . Gastroparesis 2005    Abnormal GS  . Heart disease, unspecified   .  Osteoarthritis   . IBS (irritable bowel syndrome)   . Osteopenia   . Goiter     Dr Emmit Pomfret  . Hypothyroid   . COPD (chronic obstructive pulmonary disease)   . Asthma   . Hypercholesterolemia   . HTN (hypertension)   . DM (diabetes mellitus)   . Fatty liver     Insetting of normal LFTs  . History of adenomatous polyp of colon   . Renal artery stenosis   . Pulmonary nodules     Multiple, had previously been followed by CT, stable    Past Surgical History  Procedure Date  . Tubal ligation   . Partial hysterectomy   . Cholecystectomy   . Abdominal hernia repair     x 3  . Hip surgery     left  . Ercp w/ sphincterotomy and balloon dilation   . Colonoscopy 05/29/2008    Dr. Jena Gauss diverticulosis, hemorrhoids, 2 hyperplastic polyps    Prior to Admission medications   Medication Sig Start Date End Date Taking? Authorizing Provider  albuterol (PROVENTIL HFA;VENTOLIN HFA) 108 (90 BASE) MCG/ACT inhaler Inhale 2 puffs into the lungs every 6 (six) hours as needed.     Yes Historical Provider, MD  furosemide (LASIX) 40 MG tablet Take 40 mg by mouth 2 (two) times daily.     Yes Historical Provider, MD  glipiZIDE-metformin (METAGLIP) 5-500 MG per tablet Take 1 capsule by mouth Twice daily. 09/12/11  Yes Historical Provider, MD  ibuprofen (ADVIL,MOTRIN) 800 MG tablet Take 800 mg by mouth every 8 (eight) hours  as needed. Rare   Yes Historical Provider, MD  isosorbide mononitrate (IMDUR) 30 MG 24 hr tablet Take 30 mg by mouth daily.     Yes Historical Provider, MD  lansoprazole (PREVACID) 30 MG capsule Take 1 capsule (30 mg total) by mouth daily. 10/16/11 10/15/12 Yes Lorenza Burton, NP  levothyroxine (SYNTHROID, LEVOTHROID) 75 MCG tablet Take 75 mcg by mouth daily.     Yes Historical Provider, MD  lisinopril-hydrochlorothiazide (PRINZIDE,ZESTORETIC) 20-12.5 MG per tablet Take 1 tablet by mouth 2 (two) times daily.     Yes Historical Provider, MD  metoprolol succinate (TOPROL-XL) 25 MG  24 hr tablet Take 25 mg by mouth daily. 1/2 tablet daily    Yes Historical Provider, MD  montelukast (SINGULAIR) 10 MG tablet Take 10 mg by mouth at bedtime.     Yes Historical Provider, MD  NITROSTAT 0.4 MG SL tablet as needed. 10/03/11  Yes Historical Provider, MD  nystatin-triamcinolone (MYCOLOG II) cream Apply 1 application topically 2 (two) times daily. 10/16/11  Yes Lorenza Burton, NP  tolterodine (DETROL LA) 4 MG 24 hr capsule Take 4 mg by mouth daily.     Yes Historical Provider, MD  traMADol (ULTRAM) 50 MG tablet Take 50 mg by mouth every 6 (six) hours as needed. Maximum dose= 8 tablets per day    Yes Historical Provider, MD    Allergies as of 12/19/2011 - Review Complete 12/19/2011  Allergen Reaction Noted  . Esomeprazole magnesium  02/07/2009  . Morphine      Family History  Problem Relation Age of Onset  . Colon cancer Brother 76  . Lung cancer Brother   . Cancer Sister     bladder  . Lung cancer Father     History   Social History  . Marital Status: Married    Spouse Name: N/A    Number of Children: 6  . Years of Education: N/A   Occupational History  . retired, day care owner    Social History Main Topics  . Smoking status: Never Smoker   . Smokeless tobacco: Not on file  . Alcohol Use: No  . Drug Use: No  . Sexually Active: Not on file   Other Topics Concern  . Not on file   Social History Narrative  . No narrative on file    Review of Systems: See HPI, otherwise negative ROS  Physical Exam: BP 135/75  Pulse 75  Temp(Src) 98.1 F (36.7 C) (Temporal)  Ht 5\' 2"  (1.575 m)  Wt 196 lb (88.905 kg)  BMI 35.85 kg/m2 General:   Alert,  Well-developed, well-nourished, pleasant and cooperative in NAD Skin:  Intact without significant lesions or rashes. Eyes:  Sclera clear, no icterus.   Conjunctiva pink. Ears:  Normal auditory acuity. Nose:  No deformity, discharge,  or lesions. Mouth:  No deformity or lesions. Neck:  Supple; no masses or  thyromegaly. No significant cervical adenopathy. Lungs:  Clear throughout to auscultation.   No wheezes, crackles, or rhonchi. No acute distress. Heart:  Regular rate and rhythm; no murmurs, clicks, rubs,  or gallops. Abdomen: Non-distended, normal bowel sounds. No bruits.  Soft with minimal diffuse tenderness without appreciable mass or hepatosplenomegaly.  Pulses:  Normal pulses noted. Extremities:  Without clubbing or edema.  Impression/Plan

## 2011-12-22 ENCOUNTER — Other Ambulatory Visit: Payer: Self-pay | Admitting: Gastroenterology

## 2011-12-22 DIAGNOSIS — R1011 Right upper quadrant pain: Secondary | ICD-10-CM

## 2011-12-23 LAB — HEPATIC FUNCTION PANEL
Albumin: 4.2 g/dL (ref 3.5–5.2)
Alkaline Phosphatase: 46 U/L (ref 39–117)
Total Bilirubin: 0.4 mg/dL (ref 0.3–1.2)

## 2011-12-23 LAB — LIPASE: Lipase: 39 U/L (ref 0–75)

## 2011-12-26 ENCOUNTER — Ambulatory Visit (HOSPITAL_COMMUNITY)
Admission: RE | Admit: 2011-12-26 | Discharge: 2011-12-26 | Disposition: A | Discharge status: Home / Self Care | Payer: Medicare Other | Source: Ambulatory Visit | Attending: Internal Medicine | Admitting: Internal Medicine

## 2011-12-26 ENCOUNTER — Other Ambulatory Visit (HOSPITAL_COMMUNITY): Payer: Self-pay | Admitting: Internal Medicine

## 2011-12-26 DIAGNOSIS — R1011 Right upper quadrant pain: Secondary | ICD-10-CM | POA: Insufficient documentation

## 2011-12-26 DIAGNOSIS — N6459 Other signs and symptoms in breast: Secondary | ICD-10-CM

## 2012-01-07 ENCOUNTER — Ambulatory Visit (HOSPITAL_COMMUNITY)
Admission: RE | Admit: 2012-01-07 | Discharge: 2012-01-07 | Disposition: A | Discharge status: Home / Self Care | Payer: Medicare Other | Source: Ambulatory Visit | Attending: Internal Medicine | Admitting: Internal Medicine

## 2012-01-07 DIAGNOSIS — N649 Disorder of breast, unspecified: Secondary | ICD-10-CM | POA: Insufficient documentation

## 2012-01-07 DIAGNOSIS — N6459 Other signs and symptoms in breast: Secondary | ICD-10-CM

## 2012-01-07 DIAGNOSIS — N644 Mastodynia: Secondary | ICD-10-CM | POA: Insufficient documentation

## 2012-01-21 NOTE — Progress Notes (Signed)
Quick Note:  Pt aware, pt still needs ov to come back for follow up ov. Please schedule. ______

## 2012-01-21 NOTE — Progress Notes (Signed)
Results Cc to PCP  

## 2012-07-06 DIAGNOSIS — I701 Atherosclerosis of renal artery: Secondary | ICD-10-CM

## 2012-07-06 HISTORY — DX: Atherosclerosis of renal artery: I70.1

## 2012-07-30 DIAGNOSIS — R0600 Dyspnea, unspecified: Secondary | ICD-10-CM

## 2012-07-30 HISTORY — DX: Dyspnea, unspecified: R06.00

## 2012-12-15 ENCOUNTER — Encounter: Payer: Self-pay | Admitting: Pharmacist Clinician (PhC)/ Clinical Pharmacy Specialist

## 2013-02-19 IMAGING — CT CT CTA ABD/PEL W/CM AND/OR W/O CM
2 of 12 series · 2 of 46 positions shown, 5 images · IV contrast (Omnipaque 300)
Comparison: 10/14/2010 chest CT

CLINICAL DATA: Severe abdominal pain postprandial, diabetes,
hypertension

CT ANGIOGRAPHY ABDOMEN AND PELVIS
TECHNIQUE: Multidetector CT imaging of the abdomen and pelvis was
performed using the standard protocol during bolus administration
of intravenous contrast.  Multiplanar reconstructed images
including MIPs were obtained and reviewed to evaluate the vascular
anatomy.
Contrast: 100mL OMNIPAQUE IOHEXOL 300 MG/ML IV SOLN

[Series 9: venous 5.0 b30f · axial · portal-venous · 0.78mm/px · z∈[-452,-452]mm · 1 of 84 slices shown, 3 images]
[im 1/84  soft-tissue]
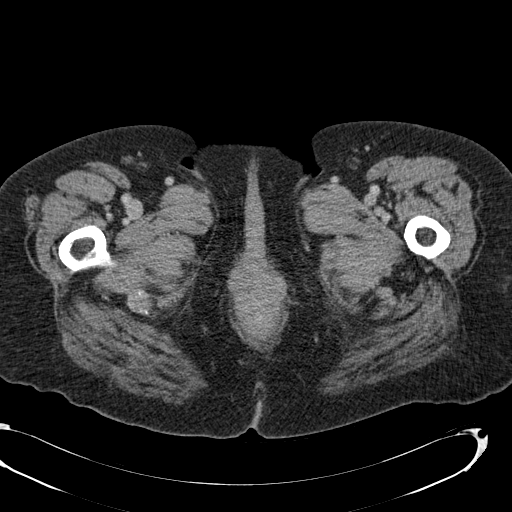
[im 1/84  lung]
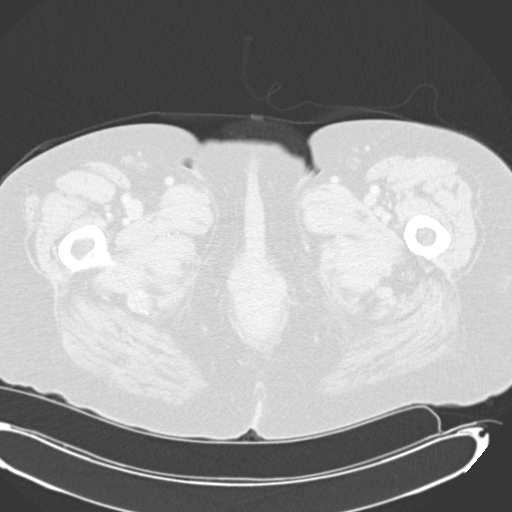
[im 1/84  bone]
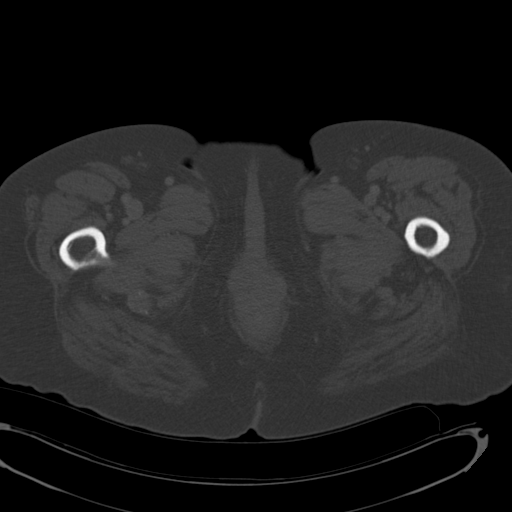

[Series 11: mpr cor post contrast cor · coronal · 0.74mm/px · 1 of 90 slices shown, 2 images]
[im 45/90  soft-tissue]
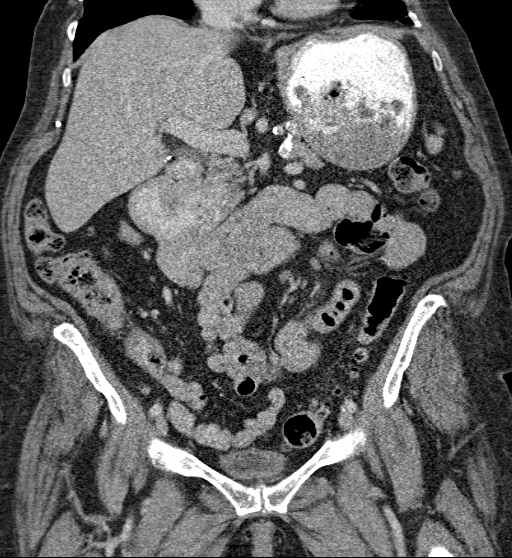
[im 45/90  bone]
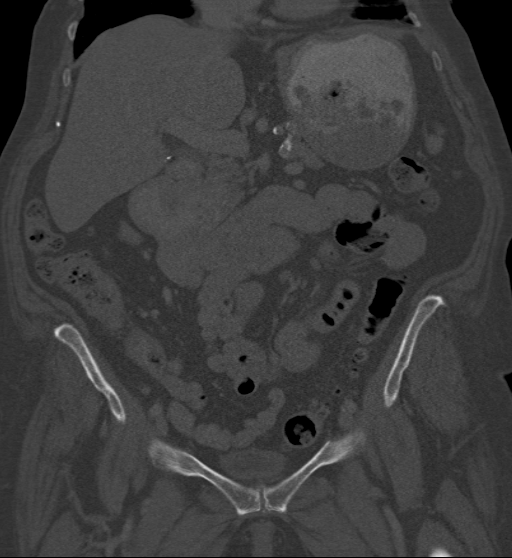

[2 of 46 positions shown; findings below may reference images not displayed]

FINDINGS: Lung bases remain clear.  Minor atelectasis.  No lower
lobe airspace process.  Normal heart size.  No pericardial or
pleural effusion.

Abdomen:  Diffuse calcific atherosclerosis of the aorta,
bifurcation and iliac vessels.  Calcification present of the
mesenteric origins and the renal origins.  Negative for aortic
aneurysm, dissection, occlusive process, or retroperitoneal
hemorrhage.

Celiac:  Calcification of the origin noted.  Only mild narrowing,
less than 50% of the celiac artery proximally.  Celiac branches
appear patent.

SMA:  Ostial calcification noted.  Origin appears focally stenotic
and appears worse than the celiac origin, estimated greater than
50% and may be hemodynamically significant.  Otherwise the SMA main
ileocolic trunk, jejunal and colic branches throughout the
mesentery are patent.

IMA:  The IMA origin is calcified but the IMA appears widely patent
on the reconstructions.

Renal arteries:  Two renal arteries on the right and one on the
left.  Renal origins appear mildly narrowed by origin
calcifications but not hemodynamically significant.

The pelvic iliac vessels including the common, internal and
external iliac arteries all appear patent.  No iliac aneurysm,
stenosis, or occlusion.  The visualized common femoral, proximal
profunda femoral, and proximal superficial femoral arteries
demonstrate atherosclerotic changes but appear patent.

Additional axial imaging:  Previous cholecystectomy noted.  Liver,
biliary system, pancreas, spleen, accessory splenule, left adrenal
gland, and kidneys are within normal limits for age and demonstrate
no acute finding.  Right adrenal gland demonstrates a well-
circumscribed 12 mm nodule, image 23 suspicious for a small right
adrenal adenoma.  Incidental duodenal diverticulum noted with an
air-fluid level, image 32.

No small bowel obstruction pattern, dilatation, or central
mesenteric abnormality.

Left descending and sigmoid colon diverticulosis noted.

No abdominal or pelvic free fluid, fluid collection, hemorrhage, or
adenopathy.  The patient is status post prior hysterectomy.  Small
left ovarian oval cyst measures 2.6 x 1.8 cm, image 62 series 9.
No inguinal abnormality or hernia identified.

Degenerative changes throughout the spine.  No compression
fracture.

 Review of the MIP images confirms the above findings.
IMPRESSION: Findings suspicious for an ostial SMA hemodynamically significant
stenosis, estimated greater than 50% by CTA.

Mild less than 50% narrowing of the celiac origin.

Patent IMA.

Based on clinical symptoms, consider further evaluation with
catheter angiography and possible SMA stent placement.

Previous cholecystectomy and hysterectomy.

Right adrenal nodule, probable adenoma.

Incidental duodenal diverticulum.

Colonic diverticulosis.

Small left ovarian cyst.

## 2013-03-31 ENCOUNTER — Encounter: Payer: Self-pay | Admitting: Cardiovascular Disease

## 2013-05-21 ENCOUNTER — Other Ambulatory Visit: Payer: Self-pay | Admitting: Cardiovascular Disease

## 2013-05-23 NOTE — Telephone Encounter (Signed)
Rx was sent to pharmacy electronically. 

## 2013-06-13 ENCOUNTER — Telehealth (HOSPITAL_COMMUNITY): Payer: Self-pay | Admitting: Cardiovascular Disease

## 2013-06-23 ENCOUNTER — Telehealth (HOSPITAL_COMMUNITY): Payer: Self-pay | Admitting: Cardiovascular Disease

## 2013-07-19 ENCOUNTER — Encounter: Payer: Self-pay | Admitting: Cardiology

## 2013-07-19 ENCOUNTER — Ambulatory Visit (INDEPENDENT_AMBULATORY_CARE_PROVIDER_SITE_OTHER): Payer: Medicare Other | Admitting: Cardiology

## 2013-07-19 VITALS — BP 144/70 | HR 87 | Ht 60.0 in | Wt 205.4 lb

## 2013-07-19 DIAGNOSIS — I701 Atherosclerosis of renal artery: Secondary | ICD-10-CM

## 2013-07-19 DIAGNOSIS — R079 Chest pain, unspecified: Secondary | ICD-10-CM

## 2013-07-19 NOTE — Patient Instructions (Signed)
Your physician recommends that you schedule a follow-up appointment in: 3 MONTHS WITH DR BRANCH  Your physician recommends that you continue on your current medications as directed. Please refer to the Current Medication list given to you today.   

## 2013-07-19 NOTE — Progress Notes (Signed)
Clinical Summary Angela [name] is a 79 y.o.female presenting for a new patient visit, former patient of [facility].   1. CAD: -cath 2008: LM nl, 10% prox LAD, the mid LAD dipped intramyocardially but no bridging was demonstrated. RCA 30% prox. Left renal 40% - negative nuclear perfusion scan Sept 2013 - echo 01/2011: LVEF >55%, grade I DDX, borderline LVH - reports intermittent chest pain for several years, previously unremarkable caths w/ recent negative stress test. - aching pain moderate to severe, left chest, denies any SOB, nausea, or diaphoresis. Occurs 2-3 times per week, lasts several minutes. Can occur at rest or with exertion Not worst w/ position. States pain is very similar to pain she has had since before 2003. - compliant w/ meds ASA 325mg , imdur, toprol, lisinopril. History of myalgias, not on statin. Does not remember which statin it was.   2. Renal artery stenosis - 06/2012 right 60-99%, patent left. Increased velocities in SMA, IMA - Cr 02/2013: 0.8. On lisinopril w/ normal renal function  3. Mesenteric stenosis: evidence of some stenosis in the SMA, IMA, and celiac. No significant abdominal pain or weight loss.    Allergies  Allergen Reactions  . Statins Other (See Comments)    myalgias  . Esomeprazole Magnesium     REACTION: rash  . Morphine     REACTION: UNKNOWN REACTION    Current Outpatient Prescriptions  Medication Sig Dispense Refill  . albuterol (PROVENTIL HFA;VENTOLIN HFA) 108 (90 BASE) MCG/ACT inhaler Inhale 2 puffs into the lungs every 6 (six) hours as needed.        . Cholecalciferol (VITAMIN D) 400 UNITS capsule Take 400 Units by mouth daily.      . furosemide (LASIX) 40 MG tablet Take 40 mg by mouth 2 (two) times daily.        Marland Kitchen glipiZIDE-metformin (METAGLIP) 5-500 MG per tablet Take 1 capsule by mouth Twice daily.      Marland Kitchen ibuprofen (ADVIL,MOTRIN) 800 MG tablet Take 800 mg by mouth every 8 (eight) hours as needed. Rare      . isosorbide  mononitrate (IMDUR) 30 MG 24 hr tablet TAKE ONE TABLET TWICE DAILY  60 tablet  9  . lansoprazole (PREVACID) 30 MG capsule Take 1 capsule (30 mg total) by mouth daily.  31 capsule  11  . levothyroxine (SYNTHROID, LEVOTHROID) 75 MCG tablet Take 75 mcg by mouth daily.        Marland Kitchen lisinopril-hydrochlorothiazide (PRINZIDE,ZESTORETIC) 20-12.5 MG per tablet Take 1 tablet by mouth 2 (two) times daily.        . metoprolol succinate (TOPROL-XL) 25 MG 24 hr tablet Take 25 mg by mouth daily. 1/2 tablet daily       . montelukast (SINGULAIR) 10 MG tablet Take 10 mg by mouth at bedtime.        Marland Kitchen NITROSTAT 0.4 MG SL tablet as needed.      . nystatin-triamcinolone (MYCOLOG II) cream Apply 1 application topically 2 (two) times daily.  60 g  0  . tolterodine (DETROL LA) 4 MG 24 hr capsule Take 4 mg by mouth daily.        . traMADol (ULTRAM) 50 MG tablet Take 50 mg by mouth every 6 (six) hours as needed. Maximum dose= 8 tablets per day        No current facility-administered medications for this visit.    Past Medical History  Diagnosis Date  . GERD (gastroesophageal reflux disease)   . Family hx of colon cancer   .  Sphincter of Oddi spasm     Status post ERCP with sphincterotomy for SOD versus papillary stenosis  . Diverticulosis of colon   . Helicobacter pylori gastritis     s/p treatment?  . Gastroparesis 2005    Abnormal GS  . Heart disease, unspecified   . Osteoarthritis   . IBS (irritable bowel syndrome)   . Osteopenia   . Goiter     Dr Emmit Pomfret  . Hypothyroid   . COPD (chronic obstructive pulmonary disease)   . Asthma   . Hypercholesterolemia   . HTN (hypertension)   . DM (diabetes mellitus)   . Fatty liver     Insetting of normal LFTs  . History of adenomatous polyp of colon   . Renal artery stenosis 07/06/12    R renal artery 60-99% diameter reduction PSV 336, EBV 107; >60% narrowing in celiac SMA and IMA  . Pulmonary nodules     Multiple, had previously been followed by CT, stable    . Chest pain     echo 01/29/11 - mild dilation of LA, mild diastolic dysfunction; cath 05/2007 - non-obstructive CAD 10% in LAD , 20-30% in RCA  . Dyspnea 07/30/12    stress test - normal perfusion all regions, EF 67%, no sig change from last study 2012  . Lower extremity edema 01/07/11    doppler -no evidence of thrombus or insufficiency    Past Surgical History  Procedure Laterality Date  . Tubal ligation    . Partial hysterectomy    . Cholecystectomy    . Abdominal hernia repair      x 3  . Hip surgery      left  . Ercp w/ sphincterotomy and balloon dilation    . Colonoscopy  05/29/2008    Dr. Jena Gauss diverticulosis, hemorrhoids, 2 hyperplastic polyps  . Cardiac catheterization  05/28/07    normal LV fcn, 30% narrowing in osteum of RCA, 40% narrowig in ostium of LRA    Family History  Problem Relation Age of Onset  . Colon cancer Brother 97  . Lung cancer Brother   . Cancer Sister     bladder  . Lung cancer Father    Lipids 02/2013: TC 152 TG 110 HDL 41 LDL 89  Social History Angela [name] reports that she has never smoked. She does not have any smokeless tobacco history on file. Angela [name] reports that she does not drink alcohol.  Review of Systems   Physical Examination p 87 bp 144/70 Wt 205  Gen: NAD CV: RRR, no m/r/g, no JVD Pulm: CTAB Abd: soft, NT, ND Ext: warm, 2+ bilateral edema    Problem List and Plan    1. CAD: no current symptoms, over 10 years of chest pain w/ negative cardiac caths and stress tests. Likely non-cardiac chest pain, vs coronary spasm or myocardial bridging (though no convinving evidence on cath) Normal LVEF. Will increase beta blocker which should help either potential etiology, she is unsure how much exactly she is taking, her daughter is to call w/ the dosing. Hx of myalgias on statin, no on currently despite evidence of mild CAD.   2. Renal artery stenosis: bp controlled, no evidence of renal dysfunction. Continue to follow  clinically, no strong indication for any intervention at this point. Previously on ACE-I, renal function remains normal. Continue to monitor.    3. Mesenteric stenosis: evidence of elevate velocities in SMA, celiac, and IMA. Denies any postprandial angina, no weight loss. Has some occasional  abdominal pain that's non-specific. Continue to follow clinically.   F/u 3 months

## 2013-07-20 ENCOUNTER — Encounter: Payer: Self-pay | Admitting: Cardiology

## 2013-07-22 ENCOUNTER — Telehealth (HOSPITAL_COMMUNITY): Payer: Self-pay | Admitting: *Deleted

## 2013-07-22 NOTE — Telephone Encounter (Signed)
  [place] [facility] LOCATED AT [facility] [address] [place], [place] [address] [phone]   Date:07/22/2013  Dear Dr.Weintraub  Our office has attempted to contact your patient Angela [name]  twice by telephone and we have also sent an appointment letter to schedule the Renal Doppler test you ordered. The patient has not responded. We will not make any further attempts to contact the patient. If any further assistance is needed for this referral, please contact our office at [phone]  Sincerely, [facility] Scheduling Team

## 2013-08-10 ENCOUNTER — Encounter: Payer: Self-pay | Admitting: Cardiology

## 2013-09-05 ENCOUNTER — Other Ambulatory Visit: Payer: Self-pay | Admitting: Cardiovascular Disease

## 2013-09-06 ENCOUNTER — Telehealth: Payer: Self-pay | Admitting: *Deleted

## 2013-09-06 MED ORDER — FUROSEMIDE 40 MG PO TABS: 40.0000 mg | ORAL_TABLET | Freq: Two times a day (BID) | ORAL | Status: DC

## 2013-09-06 NOTE — Telephone Encounter (Signed)
Fax for [facility] Furosemide 40 mg #60

## 2013-09-06 NOTE — Telephone Encounter (Signed)
rx sent to pharmacy by e-script  

## 2013-10-19 ENCOUNTER — Ambulatory Visit: Payer: Medicare Other | Admitting: Cardiology

## 2013-10-25 ENCOUNTER — Ambulatory Visit (INDEPENDENT_AMBULATORY_CARE_PROVIDER_SITE_OTHER): Payer: Medicare Other | Admitting: Cardiology

## 2013-10-25 ENCOUNTER — Encounter: Payer: Self-pay | Admitting: Cardiology

## 2013-10-25 VITALS — BP 118/58 | HR 69 | Ht 60.0 in | Wt 202.0 lb

## 2013-10-25 DIAGNOSIS — R609 Edema, unspecified: Secondary | ICD-10-CM

## 2013-10-25 DIAGNOSIS — R002 Palpitations: Secondary | ICD-10-CM

## 2013-10-25 DIAGNOSIS — R600 Localized edema: Secondary | ICD-10-CM

## 2013-10-25 DIAGNOSIS — I2510 Atherosclerotic heart disease of native coronary artery without angina pectoris: Secondary | ICD-10-CM

## 2013-10-25 NOTE — Patient Instructions (Addendum)
Your physician recommends that you schedule a follow-up appointment in: 3-4 weeks  Your physician has recommended that you wear an event monitor. Event monitors are medical devices that record the heart's electrical activity. Doctors most often [place] these monitors to diagnose arrhythmias. Arrhythmias are problems with the speed or rhythm of the heartbeat. The monitor is a small, portable device. You can wear one while you do your normal daily activities. This is usually used to diagnose what is causing palpitations/syncope (passing out).  Your physician has requested that you have an echocardiogram. Echocardiography is a painless test that uses sound waves to create images of your heart. It provides your doctor with information about the size and shape of your heart and how well your heart's chambers and valves are working. This procedure takes approximately one hour. There are no restrictions for this procedure.

## 2013-10-25 NOTE — Progress Notes (Signed)
Clinical Summary Angela [name] is a 79 y.o.female seen today for follow up. This is a focused visit, for more detailed description of multiple medical problems refer to my prior clinic notes.   1.Non-obstructive  CAD:  -cath 2008: LM nl, 10% prox LAD, the mid LAD dipped intramyocardially but no bridging was demonstrated. RCA 30% prox. Left renal 40%  - negative nuclear perfusion scan Sept 2013  - echo 01/2011: LVEF >55%, grade I DDX, borderline LVH  - reports intermittent chest pain for several years, previously unremarkable caths w/ recent negative stress test.  - aching pain moderate to severe, left chest, denies any SOB, nausea, or diaphoresis. Occurs 2-3 times per week, lasts several minutes. Can occur at rest or with exertion Not worst w/ position. States pain is very similar to pain she has had since before 2003.  - compliant w/ meds ASA 325mg , imdur, toprol, lisinopril. History of myalgias, not on statin. Does not remember which statin it was.   2. Palpitations - Feels like fluttering sensation associated with mild to moderate pain. Associated fatigue. She is not sure how long it lasts. Occurs several days a week, can occur multiple times a day. Can happen at rest or with exertion. TSH 3.055 02/2013 - increased orthopnea, increased swelling in legs over the last several weeks     Past Medical History  Diagnosis Date  . GERD (gastroesophageal reflux disease)   . Family hx of colon cancer   . Sphincter of Oddi spasm     Status post ERCP with sphincterotomy for SOD versus papillary stenosis  . Diverticulosis of colon   . Helicobacter pylori gastritis     s/p treatment?  . Gastroparesis 2005    Abnormal GS  . Heart disease, unspecified   . Osteoarthritis   . IBS (irritable bowel syndrome)   . Osteopenia   . Goiter     Dr Emmit Pomfret  . Hypothyroid   . COPD (chronic obstructive pulmonary disease)   . Asthma   . Hypercholesterolemia   . HTN (hypertension)   . DM  (diabetes mellitus)   . Fatty liver     Insetting of normal LFTs  . History of adenomatous polyp of colon   . Renal artery stenosis 07/06/12    R renal artery 60-99% diameter reduction PSV 336, EBV 107; >60% narrowing in celiac SMA and IMA  . Pulmonary nodules     Multiple, had previously been followed by CT, stable  . Chest pain     echo 01/29/11 - mild dilation of LA, mild diastolic dysfunction; cath 05/2007 - non-obstructive CAD 10% in LAD , 20-30% in RCA  . Dyspnea 07/30/12    stress test - normal perfusion all regions, EF 67%, no sig change from last study 2012  . Lower extremity edema 01/07/11    doppler -no evidence of thrombus or insufficiency     Allergies  Allergen Reactions  . Statins Other (See Comments)    myalgias  . Esomeprazole Magnesium     REACTION: rash  . Morphine     REACTION: UNKNOWN REACTION     Current Outpatient Prescriptions  Medication Sig Dispense Refill  . albuterol (PROVENTIL HFA;VENTOLIN HFA) 108 (90 BASE) MCG/ACT inhaler Inhale 2 puffs into the lungs every 6 (six) hours as needed.        Marland Kitchen aspirin 325 MG tablet Take 325 mg by mouth daily.      . cholecalciferol (VITAMIN D) 1000 UNITS tablet Take 2,000 Units  by mouth daily.      . furosemide (LASIX) 40 MG tablet Take 1 tablet (40 mg total) by mouth 2 (two) times daily.  60 tablet  6  . glipiZIDE (GLUCOTROL) 5 MG tablet Take 5 mg by mouth 2 (two) times daily before a meal.      . ibuprofen (ADVIL,MOTRIN) 800 MG tablet Take 800 mg by mouth every 8 (eight) hours as needed. Rare      . ipratropium-albuterol (DUONEB) 0.5-2.5 (3) MG/3ML SOLN Take 3 mLs by nebulization every 6 (six) hours as needed.      . isosorbide mononitrate (IMDUR) 30 MG 24 hr tablet TAKE ONE TABLET TWICE DAILY  60 tablet  9  . lansoprazole (PREVACID) 30 MG capsule Take 1 capsule (30 mg total) by mouth daily.  31 capsule  11  . levothyroxine (SYNTHROID, LEVOTHROID) 75 MCG tablet Take 75 mcg by mouth daily.        Marland Kitchen  lisinopril-hydrochlorothiazide (PRINZIDE,ZESTORETIC) 20-12.5 MG per tablet Take 1 tablet by mouth 2 (two) times daily.        . metoprolol succinate (TOPROL-XL) 25 MG 24 hr tablet Take 100 mg by mouth daily.       . montelukast (SINGULAIR) 10 MG tablet Take 10 mg by mouth at bedtime.        Marland Kitchen NITROSTAT 0.4 MG SL tablet as needed.      . nystatin-triamcinolone (MYCOLOG II) cream Apply 1 application topically 2 (two) times daily as needed.      . tolterodine (DETROL LA) 4 MG 24 hr capsule Take 4 mg by mouth daily.        . traMADol (ULTRAM) 50 MG tablet Take 50 mg by mouth every 6 (six) hours as needed. Maximum dose= 8 tablets per day        No current facility-administered medications for this visit.     Past Surgical History  Procedure Laterality Date  . Tubal ligation    . Partial hysterectomy    . Cholecystectomy    . Abdominal hernia repair      x 3  . Hip surgery      left  . Ercp w/ sphincterotomy and balloon dilation    . Colonoscopy  05/29/2008    Dr. Jena Gauss diverticulosis, hemorrhoids, 2 hyperplastic polyps  . Cardiac catheterization  05/28/07    normal LV fcn, 30% narrowing in osteum of RCA, 40% narrowig in ostium of LRA     Allergies  Allergen Reactions  . Statins Other (See Comments)    myalgias  . Esomeprazole Magnesium     REACTION: rash  . Morphine     REACTION: UNKNOWN REACTION      Family History  Problem Relation Age of Onset  . Colon cancer Brother 61  . Lung cancer Brother   . Cancer Sister     bladder  . Lung cancer Father      Social History Angela [name] reports that she has never smoked. She does not have any smokeless tobacco history on file. Angela [name] reports that she does not drink alcohol.   Review of Systems CONSTITUTIONAL: No weight loss, fever, chills, weakness or fatigue.  HEENT: Eyes: No visual loss, blurred vision, double vision or yellow sclerae.No hearing loss, sneezing, congestion, runny nose or sore throat.  SKIN: No rash  or itching.  CARDIOVASCULAR: per HPI RESPIRATORY: No shortness of breath, cough or sputum.  GASTROINTESTINAL: No anorexia, nausea, vomiting or diarrhea. No abdominal pain or blood.  GENITOURINARY: No  burning on urination, no polyuria NEUROLOGICAL: No headache, dizziness, syncope, paralysis, ataxia, numbness or tingling in the extremities. No change in bowel or bladder control.  MUSCULOSKELETAL: No muscle, back pain, joint pain or stiffness.  LYMPHATICS: No enlarged nodes. No history of splenectomy.  PSYCHIATRIC: No history of depression or anxiety.  ENDOCRINOLOGIC: No reports of sweating, cold or heat intolerance. No polyuria or polydipsia.  Marland Kitchen   Physical Examination p 69 bp 118/58 Wt 202 lbs BMI 39 Gen: resting comfortably, no acute distress HEENT: no scleral icterus, pupils equal round and reactive, no palptable cervical adenopathy,  CV: RRR, no m/r/g, no JVD Resp: Clear to auscultation bilaterally GI: abdomen is soft, non-tender, non-distended, normal bowel sounds, no hepatosplenomegaly MSK: extremities are warm, 3+ bilateral edema  Skin: warm, no rash Neuro:  no focal deficits Psych: appropriate affect     Assessment and Plan   1. Non-obstructive CAD: no new symptoms, over 10 years of chest pain w/ negative cardiac caths and stress tests. Likely non-cardiac chest pain, vs coronary spasm or myocardial bridging (though no convinving evidence on cath) Normal LVEF.  - continue current medications  2. Palpitations - will order 7 day event monitor  3. Lower extremity edema - notes worsening LE edema and orthopnea. - will repeat echo - she has only been taking her lasix qday, will start taking bid   F/u 3-4 weeks     Antoine Poche, M.D., F.A.C.C.

## 2013-10-26 DIAGNOSIS — R002 Palpitations: Secondary | ICD-10-CM

## 2013-11-03 ENCOUNTER — Other Ambulatory Visit (HOSPITAL_COMMUNITY): Payer: Self-pay | Admitting: Cardiology

## 2013-11-03 ENCOUNTER — Ambulatory Visit (HOSPITAL_COMMUNITY)
Admission: RE | Admit: 2013-11-03 | Discharge: 2013-11-03 | Disposition: A | Discharge status: Home / Self Care | Payer: Medicare Other | Source: Ambulatory Visit | Attending: Cardiology | Admitting: Cardiology

## 2013-11-03 DIAGNOSIS — I10 Essential (primary) hypertension: Secondary | ICD-10-CM | POA: Insufficient documentation

## 2013-11-03 DIAGNOSIS — I2510 Atherosclerotic heart disease of native coronary artery without angina pectoris: Secondary | ICD-10-CM | POA: Insufficient documentation

## 2013-11-03 DIAGNOSIS — I517 Cardiomegaly: Secondary | ICD-10-CM

## 2013-11-03 DIAGNOSIS — K219 Gastro-esophageal reflux disease without esophagitis: Secondary | ICD-10-CM | POA: Insufficient documentation

## 2013-11-03 DIAGNOSIS — R609 Edema, unspecified: Secondary | ICD-10-CM | POA: Insufficient documentation

## 2013-11-03 DIAGNOSIS — Z6839 Body mass index (BMI) 39.0-39.9, adult: Secondary | ICD-10-CM | POA: Insufficient documentation

## 2013-11-03 DIAGNOSIS — J449 Chronic obstructive pulmonary disease, unspecified: Secondary | ICD-10-CM | POA: Insufficient documentation

## 2013-11-03 DIAGNOSIS — E119 Type 2 diabetes mellitus without complications: Secondary | ICD-10-CM | POA: Insufficient documentation

## 2013-11-03 DIAGNOSIS — J4489 Other specified chronic obstructive pulmonary disease: Secondary | ICD-10-CM | POA: Insufficient documentation

## 2013-11-03 DIAGNOSIS — R002 Palpitations: Secondary | ICD-10-CM | POA: Insufficient documentation

## 2013-11-03 DIAGNOSIS — E785 Hyperlipidemia, unspecified: Secondary | ICD-10-CM | POA: Insufficient documentation

## 2013-11-03 NOTE — Progress Notes (Signed)
*  PRELIMINARY RESULTS* Echocardiogram 2D Echocardiogram has been performed.  Angela [name] 11/03/2013, 11:47 AM

## 2013-11-07 ENCOUNTER — Other Ambulatory Visit: Payer: Self-pay | Admitting: *Deleted

## 2013-11-07 DIAGNOSIS — R002 Palpitations: Secondary | ICD-10-CM

## 2013-11-07 NOTE — Addendum Note (Signed)
Addended by: Thompson Grayer on: 11/07/2013 02:33 PM   Modules accepted: Orders

## 2013-11-17 ENCOUNTER — Encounter (HOSPITAL_COMMUNITY): Payer: Self-pay | Admitting: Emergency Medicine

## 2013-11-17 ENCOUNTER — Emergency Department (HOSPITAL_COMMUNITY)
Admission: EM | Admit: 2013-11-17 | Discharge: 2013-11-17 | Disposition: A | Discharge status: Home / Self Care | Payer: Medicare Other | Attending: Emergency Medicine | Admitting: Emergency Medicine

## 2013-11-17 ENCOUNTER — Emergency Department (HOSPITAL_COMMUNITY): Payer: Medicare Other

## 2013-11-17 DIAGNOSIS — R5383 Other fatigue: Secondary | ICD-10-CM

## 2013-11-17 DIAGNOSIS — M1990 Unspecified osteoarthritis, unspecified site: Secondary | ICD-10-CM | POA: Insufficient documentation

## 2013-11-17 DIAGNOSIS — J45901 Unspecified asthma with (acute) exacerbation: Principal | ICD-10-CM

## 2013-11-17 DIAGNOSIS — R509 Fever, unspecified: Secondary | ICD-10-CM | POA: Insufficient documentation

## 2013-11-17 DIAGNOSIS — R059 Cough, unspecified: Secondary | ICD-10-CM

## 2013-11-17 DIAGNOSIS — Z85038 Personal history of other malignant neoplasm of large intestine: Secondary | ICD-10-CM | POA: Insufficient documentation

## 2013-11-17 DIAGNOSIS — R5381 Other malaise: Secondary | ICD-10-CM | POA: Insufficient documentation

## 2013-11-17 DIAGNOSIS — Z79899 Other long term (current) drug therapy: Secondary | ICD-10-CM | POA: Insufficient documentation

## 2013-11-17 DIAGNOSIS — E7800 Pure hypercholesterolemia, unspecified: Secondary | ICD-10-CM | POA: Insufficient documentation

## 2013-11-17 DIAGNOSIS — R05 Cough: Secondary | ICD-10-CM

## 2013-11-17 DIAGNOSIS — Z95818 Presence of other cardiac implants and grafts: Secondary | ICD-10-CM | POA: Insufficient documentation

## 2013-11-17 DIAGNOSIS — E049 Nontoxic goiter, unspecified: Secondary | ICD-10-CM | POA: Insufficient documentation

## 2013-11-17 DIAGNOSIS — Z860100 Personal history of colon polyps, unspecified: Secondary | ICD-10-CM | POA: Insufficient documentation

## 2013-11-17 DIAGNOSIS — J441 Chronic obstructive pulmonary disease with (acute) exacerbation: Secondary | ICD-10-CM | POA: Insufficient documentation

## 2013-11-17 DIAGNOSIS — J449 Chronic obstructive pulmonary disease, unspecified: Secondary | ICD-10-CM

## 2013-11-17 DIAGNOSIS — E119 Type 2 diabetes mellitus without complications: Secondary | ICD-10-CM | POA: Insufficient documentation

## 2013-11-17 DIAGNOSIS — Z7982 Long term (current) use of aspirin: Secondary | ICD-10-CM | POA: Insufficient documentation

## 2013-11-17 DIAGNOSIS — Z8619 Personal history of other infectious and parasitic diseases: Secondary | ICD-10-CM | POA: Insufficient documentation

## 2013-11-17 DIAGNOSIS — I10 Essential (primary) hypertension: Secondary | ICD-10-CM | POA: Insufficient documentation

## 2013-11-17 DIAGNOSIS — K219 Gastro-esophageal reflux disease without esophagitis: Secondary | ICD-10-CM | POA: Insufficient documentation

## 2013-11-17 DIAGNOSIS — E039 Hypothyroidism, unspecified: Secondary | ICD-10-CM | POA: Insufficient documentation

## 2013-11-17 LAB — CBC WITH DIFFERENTIAL/PLATELET
BASOS ABS: 0.1 10*3/uL (ref 0.0–0.1)
Basophils Relative: 1 % (ref 0–1)
EOS PCT: 2 % (ref 0–5)
Eosinophils Absolute: 0.2 10*3/uL (ref 0.0–0.7)
HEMATOCRIT: 37 % (ref 36.0–46.0)
Hemoglobin: 12.7 g/dL (ref 12.0–15.0)
LYMPHS PCT: 17 % (ref 12–46)
Lymphs Abs: 1.3 10*3/uL (ref 0.7–4.0)
MCH: 29.5 pg (ref 26.0–34.0)
MCHC: 34.3 g/dL (ref 30.0–36.0)
MCV: 86 fL (ref 78.0–100.0)
MONO ABS: 0.5 10*3/uL (ref 0.1–1.0)
Monocytes Relative: 7 % (ref 3–12)
NEUTROS ABS: 5.7 10*3/uL (ref 1.7–7.7)
Neutrophils Relative %: 74 % (ref 43–77)
Platelets: 315 10*3/uL (ref 150–400)
RBC: 4.3 MIL/uL (ref 3.87–5.11)
RDW: 13.4 % (ref 11.5–15.5)
WBC: 7.7 10*3/uL (ref 4.0–10.5)

## 2013-11-17 LAB — BASIC METABOLIC PANEL
BUN: 19 mg/dL (ref 6–23)
CHLORIDE: 94 meq/L — AB (ref 96–112)
CO2: 30 meq/L (ref 19–32)
CREATININE: 0.81 mg/dL (ref 0.50–1.10)
Calcium: 9.2 mg/dL (ref 8.4–10.5)
GFR calc non Af Amer: 68 mL/min — ABNORMAL LOW (ref >90)
GFR, EST AFRICAN AMERICAN: 79 mL/min — AB (ref >90)
Glucose, Bld: 214 mg/dL — ABNORMAL HIGH (ref 70–99)
POTASSIUM: 4.1 meq/L (ref 3.7–5.3)
SODIUM: 136 meq/L — AB (ref 137–147)

## 2013-11-17 MED ORDER — ALBUTEROL SULFATE (2.5 MG/3ML) 0.083% IN NEBU
5.0000 mg | INHALATION_SOLUTION | Freq: Once | RESPIRATORY_TRACT | Status: AC
  Administered 2013-11-17: 5 mg via RESPIRATORY_TRACT
  Filled 2013-11-17 (×2): qty 6

## 2013-11-17 MED ORDER — PREDNISONE 50 MG PO TABS: ORAL_TABLET | ORAL | Status: DC

## 2013-11-17 MED ORDER — DOXYCYCLINE HYCLATE 100 MG PO CAPS: 100.0000 mg | ORAL_CAPSULE | Freq: Two times a day (BID) | ORAL | Status: DC

## 2013-11-17 MED ORDER — IPRATROPIUM BROMIDE 0.02 % IN SOLN
0.5000 mg | Freq: Once | RESPIRATORY_TRACT | Status: AC
  Administered 2013-11-17: 0.5 mg via RESPIRATORY_TRACT
  Filled 2013-11-17 (×2): qty 2.5

## 2013-11-17 MED ORDER — DOXYCYCLINE HYCLATE 100 MG PO TABS
100.0000 mg | ORAL_TABLET | Freq: Once | ORAL | Status: AC
  Administered 2013-11-17: 100 mg via ORAL
  Filled 2013-11-17: qty 1

## 2013-11-17 MED ORDER — PREDNISONE 50 MG PO TABS
60.0000 mg | ORAL_TABLET | Freq: Once | ORAL | Status: AC
  Administered 2013-11-17: 04:00:00 60 mg via ORAL
  Filled 2013-11-17 (×2): qty 1

## 2013-11-17 NOTE — ED Notes (Signed)
Pt ambulating independently w/ steady gait on d/c in no acute distress, A&Ox4. D/c instructions reviewed w/ pt and family - pt and family deny any further questions or concerns at present. Rx given x2  

## 2013-11-17 NOTE — ED Notes (Signed)
Pt and family reports pt has been experiencing productive cough w/ green sputum for a few weeks, pt has experienced a fever a few days ago - however denies any recent fever. Tonight pt admits to increased shortness of breath, c/o orthopnea and exertional dyspnea. Pt has taken OTC meds w/o relief.

## 2013-11-17 NOTE — ED Provider Notes (Signed)
CSN: [id number]     Arrival date & time 11/17/13  0254 History   First MD Initiated Contact with Patient 11/17/13 0309     Chief Complaint  Patient presents with  . Shortness of Breath    Patient is a 80 y.o. female presenting with shortness of breath. The history is provided by the patient and a relative.  Shortness of Breath Severity:  Moderate Onset quality:  Gradual Duration: several weeks. Timing:  Intermittent Progression:  Worsening Chronicity:  New Relieved by:  Rest Worsened by:  Activity Associated symptoms: cough and fever   Associated symptoms: no abdominal pain and no vomiting   Associated symptoms comment:  Chest pain with cough  pt reports she has had cough and SOB for several weeks.  She reports that it would improve then worsen.  For the past several days she has had increased with sputum production.  She reports CP with cough.  No increased LE edema.    Past Medical History  Diagnosis Date  . GERD (gastroesophageal reflux disease)   . Family hx of colon cancer   . Sphincter of Oddi spasm     Status post ERCP with sphincterotomy for SOD versus papillary stenosis  . Diverticulosis of colon   . Helicobacter pylori gastritis     s/p treatment?  . Gastroparesis 2005    Abnormal GS  . Heart disease, unspecified   . Osteoarthritis   . IBS (irritable bowel syndrome)   . Osteopenia   . Goiter     Dr Emmit Pomfret  . Hypothyroid   . COPD (chronic obstructive pulmonary disease)   . Asthma   . Hypercholesterolemia   . HTN (hypertension)   . DM (diabetes mellitus)   . Fatty liver     Insetting of normal LFTs  . History of adenomatous polyp of colon   . Renal artery stenosis 07/06/12    R renal artery 60-99% diameter reduction PSV 336, EBV 107; >60% narrowing in celiac SMA and IMA  . Pulmonary nodules     Multiple, had previously been followed by CT, stable  . Chest pain     echo 01/29/11 - mild dilation of LA, mild diastolic dysfunction; cath 05/2007 -  non-obstructive CAD 10% in LAD , 20-30% in RCA  . Dyspnea 07/30/12    stress test - normal perfusion all regions, EF 67%, no sig change from last study 2012  . Lower extremity edema 01/07/11    doppler -no evidence of thrombus or insufficiency   Past Surgical History  Procedure Laterality Date  . Tubal ligation    . Partial hysterectomy    . Cholecystectomy    . Abdominal hernia repair      x 3  . Hip surgery      left  . Ercp w/ sphincterotomy and balloon dilation    . Colonoscopy  05/29/2008    Dr. Jena Gauss diverticulosis, hemorrhoids, 2 hyperplastic polyps  . Cardiac catheterization  05/28/07    normal LV fcn, 30% narrowing in osteum of RCA, 40% narrowig in ostium of LRA   Family History  Problem Relation Age of Onset  . Colon cancer Brother 40  . Lung cancer Brother   . Cancer Sister     bladder  . Lung cancer Father    History  Substance Use Topics  . Smoking status: Never Smoker   . Smokeless tobacco: Not on file  . Alcohol Use: No   OB History   Grav Para Term Preterm Abortions  TAB SAB Ect Mult Living                 Review of Systems  Constitutional: Positive for fever and fatigue.  Respiratory: Positive for cough and shortness of breath.   Gastrointestinal: Negative for vomiting and abdominal pain.  All other systems reviewed and are negative.    Allergies  Statins; Esomeprazole magnesium; and Morphine  Home Medications   Current Outpatient Rx  Name  Route  Sig  Dispense  Refill  . albuterol (PROVENTIL HFA;VENTOLIN HFA) 108 (90 BASE) MCG/ACT inhaler   Inhalation   Inhale 2 puffs into the lungs every 6 (six) hours as needed.           Marland Kitchen aspirin 325 MG tablet   Oral   Take 325 mg by mouth daily.         . cholecalciferol (VITAMIN D) 1000 UNITS tablet   Oral   Take 2,000 Units by mouth daily.         . furosemide (LASIX) 40 MG tablet   Oral   Take 1 tablet (40 mg total) by mouth 2 (two) times daily.   60 tablet   6   . glipiZIDE  (GLUCOTROL) 5 MG tablet   Oral   Take 5 mg by mouth 2 (two) times daily before a meal.         . ibuprofen (ADVIL,MOTRIN) 800 MG tablet   Oral   Take 800 mg by mouth every 8 (eight) hours as needed. Rare         . ipratropium-albuterol (DUONEB) 0.5-2.5 (3) MG/3ML SOLN   Nebulization   Take 3 mLs by nebulization every 6 (six) hours as needed.         . isosorbide mononitrate (IMDUR) 30 MG 24 hr tablet      TAKE ONE TABLET TWICE DAILY   60 tablet   9   . EXPIRED: lansoprazole (PREVACID) 30 MG capsule   Oral   Take 1 capsule (30 mg total) by mouth daily.   31 capsule   11   . levothyroxine (SYNTHROID, LEVOTHROID) 75 MCG tablet   Oral   Take 75 mcg by mouth daily.           Marland Kitchen lisinopril-hydrochlorothiazide (PRINZIDE,ZESTORETIC) 20-12.5 MG per tablet   Oral   Take 1 tablet by mouth 2 (two) times daily.           . metoprolol succinate (TOPROL-XL) 25 MG 24 hr tablet   Oral   Take 100 mg by mouth daily.          . montelukast (SINGULAIR) 10 MG tablet   Oral   Take 10 mg by mouth at bedtime.           Marland Kitchen NITROSTAT 0.4 MG SL tablet      as needed.         . nystatin-triamcinolone (MYCOLOG II) cream   Topical   Apply 1 application topically 2 (two) times daily as needed.         . tolterodine (DETROL LA) 4 MG 24 hr capsule   Oral   Take 4 mg by mouth daily.           . traMADol (ULTRAM) 50 MG tablet   Oral   Take 50 mg by mouth every 6 (six) hours as needed. Maximum dose= 8 tablets per day           BP 149/88  Pulse 67  Temp(Src) 98 F (36.7  C) (Oral)  Resp 16  Ht 5\' 1"  (1.549 m)  Wt 205 lb (92.987 kg)  BMI 38.75 kg/m2  SpO2 97%  Physical Exam CONSTITUTIONAL: Well developed/well nourished HEAD: Normocephalic/atraumatic EYES: EOMI/PERRL ENMT: Mucous membranes moist NECK: supple no meningeal signs SPINE:entire spine nontender CV: S1/S2 noted, no murmurs/rubs/gallops noted LUNGS: coarse BS noted bilaterally, scattered wheezes noted, no  apparent distress ABDOMEN: soft, nontender, no rebound or guarding GU:no cva tenderness NEURO: Pt is awake/alert, moves all extremitiesx4 EXTREMITIES: pulses normal, full ROM, chronic/symmetric edema noted to lower extremities SKIN: warm, color normal PSYCH: no abnormalities of mood noted  ED Course  Procedures (including critical care time) Labs Review Labs Reviewed  BASIC METABOLIC PANEL  CBC WITH DIFFERENTIAL   Imaging Review No results found.  EKG Interpretation    Date/Time:  Thursday November 17 2013 03:20:26 EST Ventricular Rate:  64 PR Interval:  128 QRS Duration: 74 QT Interval:  454 QTC Calculation: 468 R Axis:   -35 Text Interpretation:  Normal sinus rhythm Left axis deviation Abnormal ECG When compared with ECG of 28-Mar-2002 11:06, QT has lengthened Confirmed by Bebe ShaggyWICKLINE  MD, Caulin Begley [phone]) on 11/17/2013 3:42:48 AM            4:44 AM Pt feels much improved after neb treatments Her labs/imaging are reassuring She is in no distress Lung sounds have improved Suspect viral process that triggered COPD exacerbation She is ambulatory in the ED and feels at baseline No hypoxia noted I doubt PE/CHF Given duration of symptoms, doubt acute influenza and unlikely to have benefit from tamiflu  MDM  No diagnosis found. Nursing notes including past medical history and social history reviewed and considered in documentation xrays reviewed and considered Labs/vital reviewed and considered     Joya Gaskinsonald W Mazella Deen, MD 11/17/13 [phone]

## 2013-11-25 ENCOUNTER — Ambulatory Visit: Payer: Medicare Other | Admitting: Cardiology

## 2013-12-09 ENCOUNTER — Ambulatory Visit (INDEPENDENT_AMBULATORY_CARE_PROVIDER_SITE_OTHER): Payer: Medicare Other | Admitting: Cardiology

## 2013-12-09 ENCOUNTER — Encounter: Payer: Self-pay | Admitting: Cardiology

## 2013-12-09 VITALS — BP 145/62 | HR 61 | Ht 60.0 in | Wt 201.0 lb

## 2013-12-09 DIAGNOSIS — R002 Palpitations: Secondary | ICD-10-CM

## 2013-12-09 NOTE — Patient Instructions (Signed)
Your physician recommends that you schedule a follow-up appointment in: 3 WEEKS   Your physician has recommended that you wear a holter monitor. Holter monitors are medical devices that record the heart's electrical activity. Doctors most often use these monitors to diagnose arrhythmias. Arrhythmias are problems with the speed or rhythm of the heartbeat. The monitor is a small, portable device. You can wear one while you do your normal daily activities. This is usually used to diagnose what is causing palpitations/syncope (passing out).  WE WILL CALL YOU WITH YOUR TEST RESULTS/INSTRUCTIONS/NEXT STEPS ONCE RECEIVED BY THE PROVIDER

## 2013-12-09 NOTE — Progress Notes (Signed)
Clinical Summary Angela [name] is a 80 y.o.female seen today for folllow up of the following medical problems.   1.Non-obstructive CAD:  -cath 2008: LM nl, 10% prox LAD, the mid LAD dipped intramyocardially but no bridging was demonstrated. RCA 30% prox. Left renal 40%  - negative nuclear perfusion scan Sept 2013  - echo 01/2011: LVEF >55%, grade I DDX, borderline LVH  - reports intermittent chest pain for several years, previously unremarkable caths w/ recent negative stress test.  - aching pain moderate to severe, left chest, denies any SOB, nausea, or diaphoresis. Occurs 2-3 times per week, lasts several minutes. Can occur at rest or with exertion Not worst w/ position. States pain is very similar to pain she has had since before 2003.    2. Palpitations  - Feels like fluttering sensation associated with mild to moderate pain. Occurs daily Associated fatigue. She is not sure how long it lasts. Occurs several days a week, can occur multiple times a day. Can happen at rest or with exertion. TSH 3.055 02/2013  - increased orthopnea, increased swelling in legs over the last several weeks  - wore event monitor recently, she reports she wasn't sure how to activate it. She also had a severe cold and did not wear for a few days. Her cold kept her from doing her regular activities, and thus she did not have significant symptoms.    Past Medical History  Diagnosis Date  . GERD (gastroesophageal reflux disease)   . Family hx of colon cancer   . Sphincter of Oddi spasm     Status post ERCP with sphincterotomy for SOD versus papillary stenosis  . Diverticulosis of colon   . Helicobacter pylori gastritis     s/p treatment?  . Gastroparesis 2005    Abnormal GS  . Heart disease, unspecified   . Osteoarthritis   . IBS (irritable bowel syndrome)   . Osteopenia   . Goiter     Dr Emmit Pomfret  . Hypothyroid   . COPD (chronic obstructive pulmonary disease)   . Asthma   . Hypercholesterolemia    . HTN (hypertension)   . DM (diabetes mellitus)   . Fatty liver     Insetting of normal LFTs  . History of adenomatous polyp of colon   . Renal artery stenosis 07/06/12    R renal artery 60-99% diameter reduction PSV 336, EBV 107; >60% narrowing in celiac SMA and IMA  . Pulmonary nodules     Multiple, had previously been followed by CT, stable  . Chest pain     echo 01/29/11 - mild dilation of LA, mild diastolic dysfunction; cath 05/2007 - non-obstructive CAD 10% in LAD , 20-30% in RCA  . Dyspnea 07/30/12    stress test - normal perfusion all regions, EF 67%, no sig change from last study 2012  . Lower extremity edema 01/07/11    doppler -no evidence of thrombus or insufficiency     Allergies  Allergen Reactions  . Statins Other (See Comments)    myalgias  . Esomeprazole Magnesium     REACTION: rash  . Morphine     REACTION: UNKNOWN REACTION     Current Outpatient Prescriptions  Medication Sig Dispense Refill  . albuterol (PROVENTIL HFA;VENTOLIN HFA) 108 (90 BASE) MCG/ACT inhaler Inhale 2 puffs into the lungs every 6 (six) hours as needed.        Marland Kitchen aspirin 325 MG tablet Take 325 mg by mouth daily.      Marland Kitchen  cholecalciferol (VITAMIN D) 1000 UNITS tablet Take 2,000 Units by mouth daily.      . furosemide (LASIX) 40 MG tablet Take 1 tablet (40 mg total) by mouth 2 (two) times daily.  60 tablet  6  . glipiZIDE (GLUCOTROL) 5 MG tablet Take 5 mg by mouth 2 (two) times daily before a meal.      . ibuprofen (ADVIL,MOTRIN) 800 MG tablet Take 800 mg by mouth every 8 (eight) hours as needed. Rare      . ipratropium-albuterol (DUONEB) 0.5-2.5 (3) MG/3ML SOLN Take 3 mLs by nebulization every 6 (six) hours as needed.      . isosorbide mononitrate (IMDUR) 30 MG 24 hr tablet TAKE ONE TABLET TWICE DAILY  60 tablet  9  . levothyroxine (SYNTHROID, LEVOTHROID) 75 MCG tablet Take 75 mcg by mouth daily.        Marland Kitchen lisinopril-hydrochlorothiazide (PRINZIDE,ZESTORETIC) 20-12.5 MG per tablet Take 1 tablet by  mouth 2 (two) times daily.        . metoprolol succinate (TOPROL-XL) 25 MG 24 hr tablet Take 100 mg by mouth daily.       . montelukast (SINGULAIR) 10 MG tablet Take 10 mg by mouth at bedtime.        Marland Kitchen NITROSTAT 0.4 MG SL tablet as needed.      . nystatin-triamcinolone (MYCOLOG II) cream Apply 1 application topically 2 (two) times daily as needed.      . tolterodine (DETROL LA) 4 MG 24 hr capsule Take 4 mg by mouth daily.        . traMADol (ULTRAM) 50 MG tablet Take 50 mg by mouth every 6 (six) hours as needed. Maximum dose= 8 tablets per day       . lansoprazole (PREVACID) 30 MG capsule Take 1 capsule (30 mg total) by mouth daily.  31 capsule  11   No current facility-administered medications for this visit.     Past Surgical History  Procedure Laterality Date  . Tubal ligation    . Partial hysterectomy    . Cholecystectomy    . Abdominal hernia repair      x 3  . Hip surgery      left  . Ercp w/ sphincterotomy and balloon dilation    . Colonoscopy  05/29/2008    Dr. Jena Gauss diverticulosis, hemorrhoids, 2 hyperplastic polyps  . Cardiac catheterization  05/28/07    normal LV fcn, 30% narrowing in osteum of RCA, 40% narrowig in ostium of LRA     Allergies  Allergen Reactions  . Statins Other (See Comments)    myalgias  . Esomeprazole Magnesium     REACTION: rash  . Morphine     REACTION: UNKNOWN REACTION      Family History  Problem Relation Age of Onset  . Colon cancer Brother 72  . Lung cancer Brother   . Cancer Sister     bladder  . Lung cancer Father      Social History Angela [name] reports that she has never smoked. She does not have any smokeless tobacco history on file. Angela [name] reports that she does not drink alcohol.   Review of Systems CONSTITUTIONAL: No weight loss, fever, chills, weakness or fatigue.  HEENT: Eyes: No visual loss, blurred vision, double vision or yellow sclerae.No hearing loss, sneezing, congestion, runny nose or sore throat.    SKIN: No rash or itching.  CARDIOVASCULAR: per HPI RESPIRATORY: No shortness of breath, cough or sputum.  GASTROINTESTINAL: No anorexia, nausea, vomiting  or diarrhea. No abdominal pain or blood.  GENITOURINARY: No burning on urination, no polyuria NEUROLOGICAL: No headache, dizziness, syncope, paralysis, ataxia, numbness or tingling in the extremities. No change in bowel or bladder control.  MUSCULOSKELETAL: No muscle, back pain, joint pain or stiffness.  LYMPHATICS: No enlarged nodes. No history of splenectomy.  PSYCHIATRIC: No history of depression or anxiety.  ENDOCRINOLOGIC: No reports of sweating, cold or heat intolerance. No polyuria or polydipsia.  Marland Kitchen.   Physical Examination Filed Vitals:   12/09/13 0936  BP: 145/62  Pulse: 61   Filed Weights   12/09/13 0936  Weight: 201 lb (91.173 kg)    Gen: resting comfortably, no acute distress HEENT: no scleral icterus, pupils equal round and reactive, no palptable cervical adenopathy,  CV: RRR, no m/r/g, no JVD, no carotid bruits Resp: Clear to auscultation bilaterally GI: abdomen is soft, non-tender, non-distended, normal bowel sounds, no hepatosplenomegaly MSK: extremities are warm, 2+ bilateral edema Skin: warm, no rash Neuro:  no focal deficits Psych: appropriate affect   Diagnostic Studies 10/2013 Echo LVEF 60-65%, mild LVH, grade I diasotlic dysfunction,   10/2013 Event monitor No symptoms, no arrhythmias.   Assessment and Plan  1. Non-obstructive CAD: no new symptoms, over 10 years of chest pain w/ negative cardiac caths and stress tests. Likely non-cardiac chest pain, vs coronary spasm or myocardial bridging (though no convinving evidence on cath) Normal LVEF.  - continue current medications   2. Palpitations  - she had difficulty operating the event monitor, also reports she was mainly bed bound with a flu like illness and thus was not performing her normal activities and did not have significant symptoms - will  obtain 48 hr holter monitor   Follow up 3 weeks   Antoine PocheJonathan F. Branch, M.D., F.A.C.C.

## 2013-12-12 ENCOUNTER — Ambulatory Visit (HOSPITAL_COMMUNITY)
Admission: RE | Admit: 2013-12-12 | Discharge: 2013-12-12 | Disposition: A | Discharge status: Home / Self Care | Payer: Medicare Other | Source: Ambulatory Visit | Attending: Cardiology | Admitting: Cardiology

## 2013-12-12 DIAGNOSIS — R002 Palpitations: Secondary | ICD-10-CM

## 2013-12-12 NOTE — Progress Notes (Signed)
48 hour Holter Monitor in progress. 

## 2013-12-30 ENCOUNTER — Encounter: Payer: Self-pay | Admitting: Adult Health

## 2013-12-30 ENCOUNTER — Encounter: Payer: Medicare Other | Admitting: Adult Health

## 2013-12-30 NOTE — Progress Notes (Signed)
HPI: [name] is a 80 year old patient of Dr. branch we are following for ongoing assessment and management of nonobstructive CAD, cardiac catheterization in 2008, with a negative stress test in 2012, and palpitations.   She to recent Holter monitor completed revealing sinus rhythm with sinus arrhythmia PACs and PVCs infusion beats. There was ventricular bigeminy and trigeminy. There was a brief nonsustained run of both SVT and atrial fibrillation. There are no long pauses. No diary was returned. This was evaluated by Dr. Beulah Gandy on 12/29/2013.  Echocardiogram completed in December of 2014 demonstrated normal LV systolic function with grade 1 diastolic dysfunction. TSH was recently checked and found to be normal.  The patient was recently admitted to [facility] on 12/09/2013 with complaints of atrial fibrillation with RVR, and flulike symptoms.    Allergies  Allergen Reactions  . Statins Other (See Comments)    myalgias  . Esomeprazole Magnesium     REACTION: rash  . Morphine     REACTION: UNKNOWN REACTION    Current Outpatient Prescriptions  Medication Sig Dispense Refill  . albuterol (PROVENTIL HFA;VENTOLIN HFA) 108 (90 BASE) MCG/ACT inhaler Inhale 2 puffs into the lungs every 6 (six) hours as needed.        Marland Kitchen aspirin 325 MG tablet Take 325 mg by mouth daily.      . cholecalciferol (VITAMIN D) 1000 UNITS tablet Take 2,000 Units by mouth daily.      . furosemide (LASIX) 40 MG tablet Take 1 tablet (40 mg total) by mouth 2 (two) times daily.  60 tablet  6  . glipiZIDE (GLUCOTROL) 5 MG tablet Take 5 mg by mouth 2 (two) times daily before a meal.      . ibuprofen (ADVIL,MOTRIN) 800 MG tablet Take 800 mg by mouth every 8 (eight) hours as needed. Rare      . ipratropium-albuterol (DUONEB) 0.5-2.5 (3) MG/3ML SOLN Take 3 mLs by nebulization every 6 (six) hours as needed.      . isosorbide mononitrate (IMDUR) 30 MG 24 hr tablet TAKE ONE TABLET TWICE DAILY  60 tablet  9  .  lansoprazole (PREVACID) 30 MG capsule Take 1 capsule (30 mg total) by mouth daily.  31 capsule  11  . levothyroxine (SYNTHROID, LEVOTHROID) 75 MCG tablet Take 75 mcg by mouth daily.        Marland Kitchen lisinopril-hydrochlorothiazide (PRINZIDE,ZESTORETIC) 20-12.5 MG per tablet Take 1 tablet by mouth 2 (two) times daily.        . metoprolol succinate (TOPROL-XL) 25 MG 24 hr tablet Take 100 mg by mouth daily.       . montelukast (SINGULAIR) 10 MG tablet Take 10 mg by mouth at bedtime.        Marland Kitchen NITROSTAT 0.4 MG SL tablet as needed.      . nystatin-triamcinolone (MYCOLOG II) cream Apply 1 application topically 2 (two) times daily as needed.      . tolterodine (DETROL LA) 4 MG 24 hr capsule Take 4 mg by mouth daily.        . traMADol (ULTRAM) 50 MG tablet Take 50 mg by mouth every 6 (six) hours as needed. Maximum dose= 8 tablets per day        No current facility-administered medications for this visit.    Past Medical History  Diagnosis Date  . GERD (gastroesophageal reflux disease)   . Family hx of colon cancer   . Sphincter of Oddi spasm     Status post ERCP with sphincterotomy  for SOD versus papillary stenosis  . Diverticulosis of colon   . Helicobacter pylori gastritis     s/p treatment?  . Gastroparesis 2005    Abnormal GS  . Heart disease, unspecified   . Osteoarthritis   . IBS (irritable bowel syndrome)   . Osteopenia   . Goiter     Dr Emmit Pomfretarol Wolicki  . Hypothyroid   . COPD (chronic obstructive pulmonary disease)   . Asthma   . Hypercholesterolemia   . HTN (hypertension)   . DM (diabetes mellitus)   . Fatty liver     Insetting of normal LFTs  . History of adenomatous polyp of colon   . Renal artery stenosis 07/06/12    R renal artery 60-99% diameter reduction PSV 336, EBV 107; >60% narrowing in celiac SMA and IMA  . Pulmonary nodules     Multiple, had previously been followed by CT, stable  . Chest pain     echo 01/29/11 - mild dilation of LA, mild diastolic dysfunction; cath 05/2007 -  non-obstructive CAD 10% in LAD , 20-30% in RCA  . Dyspnea 07/30/12    stress test - normal perfusion all regions, EF 67%, no sig change from last study 2012  . Lower extremity edema 01/07/11    doppler -no evidence of thrombus or insufficiency    Past Surgical History  Procedure Laterality Date  . Tubal ligation    . Partial hysterectomy    . Cholecystectomy    . Abdominal hernia repair      x 3  . Hip surgery      left  . Ercp w/ sphincterotomy and balloon dilation    . Colonoscopy  05/29/2008    Dr. Jena Gaussourk-> diverticulosis, hemorrhoids, 2 hyperplastic polyps  . Cardiac catheterization  05/28/07    normal LV fcn, 30% narrowing in osteum of RCA, 40% narrowig in ostium of LRA    ROS: PHYSICAL EXAM There were no vitals taken for this visit.  EKG:  ASSESSMENT AND PLAN

## 2014-01-03 ENCOUNTER — Ambulatory Visit: Payer: Medicare Other | Admitting: Adult Health

## 2014-01-06 ENCOUNTER — Ambulatory Visit: Payer: Medicare Other | Admitting: Adult Health

## 2014-01-09 ENCOUNTER — Ambulatory Visit (INDEPENDENT_AMBULATORY_CARE_PROVIDER_SITE_OTHER): Payer: Medicare Other | Admitting: Adult Health

## 2014-01-09 ENCOUNTER — Telehealth: Payer: Self-pay | Admitting: *Deleted

## 2014-01-09 ENCOUNTER — Encounter: Payer: Self-pay | Admitting: Adult Health

## 2014-01-09 VITALS — BP 155/67 | HR 91 | Ht 61.0 in | Wt 200.0 lb

## 2014-01-09 DIAGNOSIS — R0989 Other specified symptoms and signs involving the circulatory and respiratory systems: Secondary | ICD-10-CM

## 2014-01-09 DIAGNOSIS — I10 Essential (primary) hypertension: Secondary | ICD-10-CM

## 2014-01-09 DIAGNOSIS — R002 Palpitations: Secondary | ICD-10-CM

## 2014-01-09 MED ORDER — METOPROLOL SUCCINATE ER 100 MG PO TB24: ORAL_TABLET | ORAL | Status: AC

## 2014-01-09 NOTE — Patient Instructions (Signed)
Your physician recommends that you schedule a follow-up appointment in: [facility]  Your physician has recommended you make the following change in your medication:   1) INCREASE METOPROLOL 100MG  IN THE AM AND 50MG  IN THE PM   Your physician has requested that you have a carotid duplex. This test is an ultrasound of the carotid arteries in your neck. It looks at blood flow through these arteries that supply the brain with blood. Allow one hour for this exam. There are no restrictions or special instructions.  WE WILL CALL YOU WITH THE RESULTS ONCE REVIEWED

## 2014-01-09 NOTE — Progress Notes (Deleted)
Name: Angela [name]    DOB: Sep 13, 1933  Age: 80 y.o.  MR#: [id number]       PCP:  Maricela Curet, MD      Insurance: Payor: MEDICARE / Plan: MEDICARE PART A AND B / Product Type: *No Product type* /   CC:   No chief complaint on file.   VS Filed Vitals:   01/09/14 1529  BP: 155/67  Pulse: 91  Height: _0  (1.549 m)  Weight: 200 lb (90.719 kg)    Weights Current Weight  01/09/14 200 lb (90.719 kg)  12/09/13 201 lb (91.173 kg)  11/17/13 205 lb (92.987 kg)    Blood Pressure  BP Readings from Last 3 Encounters:  01/09/14 155/67  12/09/13 145/62  11/17/13 149/88     Admit date:  (Not on file) Last encounter with RMR:  Visit date not found   Allergy Statins; Esomeprazole magnesium; and Morphine  Current Outpatient Prescriptions  Medication Sig Dispense Refill  . albuterol (PROVENTIL HFA;VENTOLIN HFA) 108 (90 BASE) MCG/ACT inhaler Inhale 2 puffs into the lungs every 6 (six) hours as needed.        Marland Kitchen aspirin 325 MG tablet Take 325 mg by mouth daily.      . cholecalciferol (VITAMIN D) 1000 UNITS tablet Take 2,000 Units by mouth daily.      . furosemide (LASIX) 40 MG tablet Take 1 tablet (40 mg total) by mouth 2 (two) times daily.  60 tablet  6  . glipiZIDE (GLUCOTROL) 5 MG tablet Take 5 mg by mouth 2 (two) times daily before a meal.      . ibuprofen (ADVIL,MOTRIN) 800 MG tablet Take 800 mg by mouth every 8 (eight) hours as needed. Rare      . ipratropium-albuterol (DUONEB) 0.5-2.5 (3) MG/3ML SOLN Take 3 mLs by nebulization every 6 (six) hours as needed.      . isosorbide mononitrate (IMDUR) 30 MG 24 hr tablet TAKE ONE TABLET TWICE DAILY  60 tablet  9  . lansoprazole (PREVACID) 30 MG capsule Take 1 capsule (30 mg total) by mouth daily.  31 capsule  11  . levothyroxine (SYNTHROID, LEVOTHROID) 75 MCG tablet Take 75 mcg by mouth daily.        Marland Kitchen lisinopril-hydrochlorothiazide (PRINZIDE,ZESTORETIC) 20-12.5 MG per tablet Take 1 tablet by mouth 2 (two) times daily.        .  metoprolol succinate (TOPROL-XL) 25 MG 24 hr tablet Take 100 mg by mouth daily.       . montelukast (SINGULAIR) 10 MG tablet Take 10 mg by mouth at bedtime.        Marland Kitchen NITROSTAT 0.4 MG SL tablet as needed.      . nystatin-triamcinolone (MYCOLOG II) cream Apply 1 application topically 2 (two) times daily as needed.      . tolterodine (DETROL LA) 4 MG 24 hr capsule Take 4 mg by mouth daily.        . traMADol (ULTRAM) 50 MG tablet Take 50 mg by mouth every 6 (six) hours as needed. Maximum dose= 8 tablets per day        No current facility-administered medications for this visit.    Discontinued Meds:   There are no discontinued medications.  Patient Active Problem List   Diagnosis Date Noted  . Abdominal pain 10/16/2011  . COLONIC POLYPS, HX OF 09/21/2009  . CANDIDIASIS, SKIN 02/07/2009  . DIVERTICULOSIS OF COLON 02/07/2009  . FATTY LIVER DISEASE 02/07/2009  . SPASM OF SPHINCTER OF ODDI  02/07/2009  . HELICOBACTER PYLORI GASTRITIS, HX OF 02/07/2009  . GOITER 02/06/2009  . HYPOTHYROIDISM 02/06/2009  . DIABETES MELLITUS 02/06/2009  . HYPERCHOLESTEROLEMIA 02/06/2009  . HYPERTENSION 02/06/2009  . UNSPECIFIED HEART DISEASE 02/06/2009  . ASTHMA 02/06/2009  . COPD 02/06/2009  . GERD 02/06/2009  . GASTROPARESIS 02/06/2009  . IBS 02/06/2009  . OSTEOARTHRITIS 02/06/2009  . OSTEOPENIA 02/06/2009    LABS    Component Value Date/Time   NA 136* 11/17/2013 0320   NA 143 10/16/2011 0925   K 4.1 11/17/2013 0320   K 4.1 10/16/2011 0925   CL 94* 11/17/2013 0320   CL 100 10/16/2011 0925   CO2 30 11/17/2013 0320   CO2 32 10/16/2011 0925   GLUCOSE 214* 11/17/2013 0320   GLUCOSE 147* 10/16/2011 0925   BUN 19 11/17/2013 0320   BUN 13 10/16/2011 0925   CREATININE 0.81 11/17/2013 0320   CREATININE 0.73 10/16/2011 0925   CREATININE 0.65 08/13/2010 1310   CREATININE 0.70 03/12/2009 1213   CALCIUM 9.2 11/17/2013 0320   CALCIUM 10.1 10/16/2011 0925   GFRNONAA 68* 11/17/2013 0320   GFRNONAA >60 08/13/2010 1310    GFRNONAA >60 03/12/2009 1213   GFRAA 79* 11/17/2013 0320   GFRAA  Value: >60        The eGFR has been calculated using the MDRD equation. This calculation has not been validated in all clinical situations. eGFR's persistently <60 mL/min signify possible Chronic Kidney Disease. 08/13/2010 1310   GFRAA  Value: >60        The eGFR has been calculated using the MDRD equation. This calculation has not been validated in all clinical situations. eGFR's persistently <60 mL/min signify possible Chronic Kidney Disease. 03/12/2009 1213   CMP     Component Value Date/Time   NA 136* 11/17/2013 0320   K 4.1 11/17/2013 0320   CL 94* 11/17/2013 0320   CO2 30 11/17/2013 0320   GLUCOSE 214* 11/17/2013 0320   BUN 19 11/17/2013 0320   CREATININE 0.81 11/17/2013 0320   CREATININE 0.73 10/16/2011 0925   CALCIUM 9.2 11/17/2013 0320   PROT 6.2 12/22/2011 1930   ALBUMIN 4.2 12/22/2011 1930   AST 14 12/22/2011 1930   ALT 12 12/22/2011 1930   ALKPHOS 46 12/22/2011 1930   BILITOT 0.4 12/22/2011 1930   GFRNONAA 68* 11/17/2013 0320   GFRAA 79* 11/17/2013 0320       Component Value Date/Time   WBC 7.7 11/17/2013 0320   WBC 6.1 10/16/2011 0925   HGB 12.7 11/17/2013 0320   HGB 14.2 10/16/2011 0925   HCT 37.0 11/17/2013 0320   HCT 41.8 10/16/2011 0925   MCV 86.0 11/17/2013 0320   MCV 85.3 10/16/2011 0925    Lipid Panel  No results found for this basename: chol, trig, hdl, cholhdl, vldl, ldlcalc    ABG No results found for this basename: phart, pco2, pco2art, po2, po2art, hco3, tco2, acidbasedef, o2sat     No results found for this basename: TSH   BNP (last 3 results) No results found for this basename: PROBNP,  in the last 8760 hours Cardiac Panel (last 3 results) No results found for this basename: CKTOTAL, CKMB, TROPONINI, RELINDX,  in the last 72 hours  Iron/TIBC/Ferritin No results found for this basename: iron, tibc, ferritin     EKG Orders placed in visit on 12/09/13  . HOLTER MONITOR - 33 HOUR     Prior Assessment and  Plan Problem List as of 01/09/2014   CANDIDIASIS, SKIN   GOITER  HYPOTHYROIDISM   DIABETES MELLITUS   HYPERCHOLESTEROLEMIA   HYPERTENSION   UNSPECIFIED HEART DISEASE   ASTHMA   COPD   GERD   Last Assessment & Plan   10/16/2011 Office Visit Written 10/16/2011 10:39 AM by Andria Meuse, NP     Possibly contributing to abdominal pain. Stop Prevacid 50 mg daily and increase to Prevacid 30 mg daily    GASTROPARESIS   Last Assessment & Plan   10/16/2011 Office Visit Written 10/16/2011 10:40 AM by Andria Meuse, NP     Treatment options for her gastroparesis are significantly limited at this time. She is having little in the way of nausea and vomiting.    DIVERTICULOSIS OF COLON   IBS   Last Assessment & Plan   10/16/2011 Office Visit Edited 10/16/2011 10:40 AM by Andria Meuse, NP     At her baseline. Denies any problems with constipation or diarrhea due to IBS currently.    FATTY LIVER DISEASE   Last Assessment & Plan   10/16/2011 Office Visit Written 10/16/2011 10:41 AM by Andria Meuse, NP     Will recheck LFTs and have good visualization of the liver on CT.    SPASM OF SPHINCTER OF ODDI   OSTEOARTHRITIS   OSTEOPENIA   COLONIC POLYPS, HX OF   HELICOBACTER PYLORI GASTRITIS, HX OF   Abdominal pain   Last Assessment & Plan   12/19/2011 Office Visit Edited 12/21/2011 10:36 AM by Daneil Dolin, MD     Currently, abdominal pain not consistent with mesenteric ischemia. She does have ongoing complaints of abdominal pain, however which are fairly nonspecific.  Had documented a 2 pound weight loss since her last visit. Mesenteric arterial stenosis followed closely by [facility] heart and vascular. Apparently, no intervention felt to be warranted at this time ( I agree with that approach).  May be having some breakthrough reflux symptoms on Prevacid.  Gastroparesis symptoms clinically silent at this time. Symptoms consistent with a URI.  Ultrasound of right upper quadrant to check  liver and bile ducts further  Dexilant 60 mg daily for GERD. Samples provided. Stop current PPI  Z-Pak as directed; followup with Dr. Nevada Crane if sore gums and throat do not improve with these measures.  lipase and hepatic blood work to be drawn.   Plan for followup colonoscopy 2014 - history of colonic polyps  Office followup here in 6 weeks for weight check, etc.             Imaging: No results found.

## 2014-01-09 NOTE — Telephone Encounter (Signed)
Pt in office today and wanted to discuss the results from her holter monitor, noted pt started with event monitor and unable to tolerate, was placed on 48 hour holter 12-12-13 however the report is not in office currently and has not been scanned into the chart, Dr Wyline MoodBranch advised HD LPN via telephone in the [place] office that he does not have the report nor does he remember reading the report for this pt, [facility] [place] to see if possible the report is in the [place] office, she noted the report is not in the [place] office and if the report was received and reviewed then protocol for the [place] office is after the report was faxed the staff member faxing the report would document in the chart the completion and manual fax of the report, no documentation noted in the pt chart to advise the report was ever read, pt advised by Lorin PicketKathyrn Lawrence NP during her OV today that once we locate the report we will call her, pt medications adjusted at OV today, message left from this nurse on vm for PB RN to please return call per Stress lab closed at this time, pt understood manager YL made aware verbally of update

## 2014-01-10 ENCOUNTER — Encounter: Payer: Self-pay | Admitting: Adult Health

## 2014-01-10 DIAGNOSIS — R002 Palpitations: Secondary | ICD-10-CM | POA: Insufficient documentation

## 2014-01-10 NOTE — Assessment & Plan Note (Addendum)
Blood pressure moderately controlled. Heart rate is 91. I will increase her metoprolol to 100 mg in the morning and 50 mg in the evening to assist with palpitations and blood pressure control. The patient a followup in the office in one month for further evaluation and treatment. She is to call his should she not be able to tolerate this higher dose.  I spent greater than 35 minutes with this patient. Given reassurance, and listening to her anxiety issues concerning the death of her husband. This is also playing into her blood pressure. I suggested that she speak with her primary care physician concerning need for anti-anxiety or depression medications temporarily in the setting of her recent loss of her husband. She verbalizes understanding.

## 2014-01-10 NOTE — Assessment & Plan Note (Signed)
Holter monitor results will be presented to Dr. Wyline MoodBranch, for his review, and discussion on followup appointment.

## 2014-01-10 NOTE — Progress Notes (Signed)
HPI: Angela [name] is a 80 year old patient of Dr. Wyline Mood  following for ongoing assessment and management of nonobstructive CAD, with most recent cath in 2008; negative stress test in September 2013. She also has complaints of palpitations, anxiety and depression. The patient recently was widowed in November of 2014 has been having intermittent discomfort and palpitations, when thinking about or reliving the death of her husband. When seen by Dr. Wyline Mood last, she had a Holter monitor placed. At the time of this appointment, the monitor report has not been reviewed or read,    She comes today with continued complaints of palpitations and is very tearful when discussing her husband. She states each time this happens her heart races. She states it lasts less than 5-10 minutes and goes away when she stops thinking about her husband. He occasionally complains of chest pain as well. She has not been seen by her primary care physician to assist with anxiety. This has been suggested.  Allergies  Allergen Reactions  . Statins Other (See Comments)    myalgias  . Esomeprazole Magnesium     REACTION: rash  . Morphine     REACTION: UNKNOWN REACTION    Current Outpatient Prescriptions  Medication Sig Dispense Refill  . albuterol (PROVENTIL HFA;VENTOLIN HFA) 108 (90 BASE) MCG/ACT inhaler Inhale 2 puffs into the lungs every 6 (six) hours as needed.        Marland Kitchen aspirin 325 MG tablet Take 325 mg by mouth daily.      . cholecalciferol (VITAMIN D) 1000 UNITS tablet Take 2,000 Units by mouth daily.      . furosemide (LASIX) 40 MG tablet Take 1 tablet (40 mg total) by mouth 2 (two) times daily.  60 tablet  6  . glipiZIDE (GLUCOTROL) 5 MG tablet Take 5 mg by mouth 2 (two) times daily before a meal.      . ibuprofen (ADVIL,MOTRIN) 800 MG tablet Take 800 mg by mouth every 8 (eight) hours as needed. Rare      . ipratropium-albuterol (DUONEB) 0.5-2.5 (3) MG/3ML SOLN Take 3 mLs by nebulization every 6 (six) hours as  needed.      . isosorbide mononitrate (IMDUR) 30 MG 24 hr tablet TAKE ONE TABLET TWICE DAILY  60 tablet  9  . lansoprazole (PREVACID) 30 MG capsule Take 1 capsule (30 mg total) by mouth daily.  31 capsule  11  . levothyroxine (SYNTHROID, LEVOTHROID) 75 MCG tablet Take 75 mcg by mouth daily.        Marland Kitchen lisinopril-hydrochlorothiazide (PRINZIDE,ZESTORETIC) 20-12.5 MG per tablet Take 1 tablet by mouth 2 (two) times daily.        . montelukast (SINGULAIR) 10 MG tablet Take 10 mg by mouth at bedtime.        Marland Kitchen NITROSTAT 0.4 MG SL tablet as needed.      . nystatin-triamcinolone (MYCOLOG II) cream Apply 1 application topically 2 (two) times daily as needed.      . tolterodine (DETROL LA) 4 MG 24 hr capsule Take 4 mg by mouth daily.        . traMADol (ULTRAM) 50 MG tablet Take 50 mg by mouth every 6 (six) hours as needed. Maximum dose= 8 tablets per day       . metoprolol succinate (TOPROL-XL) 100 MG 24 hr tablet TAKE ONE MG IN THE AM AND 5OMG IN THE PM  60 tablet  6   No current facility-administered medications for this visit.    Past Medical History  Diagnosis Date  . GERD (gastroesophageal reflux disease)   . Family hx of colon cancer   . Sphincter of Oddi spasm     Status post ERCP with sphincterotomy for SOD versus papillary stenosis  . Diverticulosis of colon   . Helicobacter pylori gastritis     s/p treatment?  . Gastroparesis 2005    Abnormal GS  . Heart disease, unspecified   . Osteoarthritis   . IBS (irritable bowel syndrome)   . Osteopenia   . Goiter     Dr Emmit Pomfretarol Wolicki  . Hypothyroid   . COPD (chronic obstructive pulmonary disease)   . Asthma   . Hypercholesterolemia   . HTN (hypertension)   . DM (diabetes mellitus)   . Fatty liver     Insetting of normal LFTs  . History of adenomatous polyp of colon   . Renal artery stenosis 07/06/12    R renal artery 60-99% diameter reduction PSV 336, EBV 107; >60% narrowing in celiac SMA and IMA  . Pulmonary nodules     Multiple, had  previously been followed by CT, stable  . Chest pain     echo 01/29/11 - mild dilation of LA, mild diastolic dysfunction; cath 05/2007 - non-obstructive CAD 10% in LAD , 20-30% in RCA  . Dyspnea 07/30/12    stress test - normal perfusion all regions, EF 67%, no sig change from last study 2012  . Lower extremity edema 01/07/11    doppler -no evidence of thrombus or insufficiency    Past Surgical History  Procedure Laterality Date  . Tubal ligation    . Partial hysterectomy    . Cholecystectomy    . Abdominal hernia repair      x 3  . Hip surgery      left  . Ercp w/ sphincterotomy and balloon dilation    . Colonoscopy  05/29/2008    Dr. Jena Gaussourk-> diverticulosis, hemorrhoids, 2 hyperplastic polyps  . Cardiac catheterization  05/28/07    normal LV fcn, 30% narrowing in osteum of RCA, 40% narrowig in ostium of LRA    ROS: Review of systems complete and found to be negative unless listed above  PHYSICAL EXAM BP 155/67  Pulse 91  Ht 5\' 1"  (1.549 m)  Wt 200 lb (90.719 kg)  BMI 37.81 kg/m2  General: Well developed, well nourished, in no acute distress Head: Eyes PERRLA, No xanthomas.   Normal cephalic and atramatic  Lungs: Clear bilaterally to auscultation and percussion. Heart: HRRR S1 S2, without MRG.  Pulses are 2+ & equal.            No carotid bruit. No JVD.  No abdominal bruits. No femoral bruits. Abdomen: Bowel sounds are positive, abdomen soft and non-tender without masses or                  Hernia's noted. Msk:  Back normal, normal gait. Normal strength and tone for age. Extremities: No clubbing, cyanosis or edema.  DP +1 Neuro: Alert and oriented X 3. Psych:  Tearful and anxious affect, responds appropriately    ASSESSMENT AND PLAN

## 2014-01-11 NOTE — Telephone Encounter (Signed)
Advised the report was given to manager YL on Tuesday however the office was closed due to weather, report retrieved from YL and given to Dr. Purvis SheffieldKoneswaran for review, advised pt was advised about results via Lorin PicketKathyrn Lawrence NP at recent OV however the final report needs to be reviewed/resulted and scanned into the pt chart, Dr. Purvis SheffieldKoneswaran advised he will complete and place in scan basket to have scanned into pt chart. Pt aware at OV

## 2014-01-12 ENCOUNTER — Ambulatory Visit (HOSPITAL_COMMUNITY): Payer: Medicare Other

## 2014-01-13 ENCOUNTER — Other Ambulatory Visit: Payer: Self-pay | Admitting: *Deleted

## 2014-01-13 DIAGNOSIS — R002 Palpitations: Secondary | ICD-10-CM

## 2014-01-17 ENCOUNTER — Ambulatory Visit (HOSPITAL_COMMUNITY)
Admission: RE | Admit: 2014-01-17 | Discharge: 2014-01-17 | Disposition: A | Discharge status: Home / Self Care | Payer: Medicare Other | Source: Ambulatory Visit | Attending: Adult Health | Admitting: Adult Health

## 2014-01-17 DIAGNOSIS — R55 Syncope and collapse: Secondary | ICD-10-CM | POA: Insufficient documentation

## 2014-01-17 DIAGNOSIS — I6529 Occlusion and stenosis of unspecified carotid artery: Secondary | ICD-10-CM | POA: Insufficient documentation

## 2014-01-17 DIAGNOSIS — E119 Type 2 diabetes mellitus without complications: Secondary | ICD-10-CM | POA: Insufficient documentation

## 2014-01-17 DIAGNOSIS — R0989 Other specified symptoms and signs involving the circulatory and respiratory systems: Secondary | ICD-10-CM

## 2014-02-20 ENCOUNTER — Encounter: Payer: Medicare Other | Admitting: Cardiology

## 2014-02-20 ENCOUNTER — Encounter: Payer: Self-pay | Admitting: Cardiology

## 2014-02-20 NOTE — Progress Notes (Signed)
Clinical Summary Angela [name] is a 80 y.o.female  1.Non-obstructive CAD:  -cath 2008: LM nl, 10% prox LAD, the mid LAD dipped intramyocardially but no bridging was demonstrated. RCA 30% prox. Left renal 40%  - negative nuclear perfusion scan Sept 2013  - echo 01/2011: LVEF >55%, grade I DDX, borderline LVH  - reports intermittent chest pain for several years, previously unremarkable caths w/ recent negative stress test.  - aching pain moderate to severe, left chest, denies any SOB, nausea, or diaphoresis. Occurs 2-3 times per week, lasts several minutes. Can occur at rest or with exertion Not worst w/ position. States pain is very similar to pain she has had since before 2003.    2. Palpitations  - Feels like fluttering sensation associated with mild to moderate pain. Occurs daily Associated fatigue. She is not sure how long it lasts. Occurs several days a week, can occur multiple times a day. Can happen at rest or with exertion. TSH 3.055 02/2013  - increased orthopnea, increased swelling in legs over the last several weeks  - wore event monitor recently, she reports she wasn't sure how to activate it. She also had a severe cold and did not wear for a few days. Her cold kept her from doing her regular activities, and thus she did not have significant symptoms.    Past Medical History  Diagnosis Date  . GERD (gastroesophageal reflux disease)   . Family hx of colon cancer   . Sphincter of Oddi spasm     Status post ERCP with sphincterotomy for SOD versus papillary stenosis  . Diverticulosis of colon   . Helicobacter pylori gastritis     s/p treatment?  . Gastroparesis 2005    Abnormal GS  . Heart disease, unspecified   . Osteoarthritis   . IBS (irritable bowel syndrome)   . Osteopenia   . Goiter     Dr Emmit Pomfretarol Wolicki  . Hypothyroid   . COPD (chronic obstructive pulmonary disease)   . Asthma   . Hypercholesterolemia   . HTN (hypertension)   . DM (diabetes mellitus)   . Fatty  liver     Insetting of normal LFTs  . History of adenomatous polyp of colon   . Renal artery stenosis 07/06/12    R renal artery 60-99% diameter reduction PSV 336, EBV 107; >60% narrowing in celiac SMA and IMA  . Pulmonary nodules     Multiple, had previously been followed by CT, stable  . Chest pain     echo 01/29/11 - mild dilation of LA, mild diastolic dysfunction; cath 05/2007 - non-obstructive CAD 10% in LAD , 20-30% in RCA  . Dyspnea 07/30/12    stress test - normal perfusion all regions, EF 67%, no sig change from last study 2012  . Lower extremity edema 01/07/11    doppler -no evidence of thrombus or insufficiency     Allergies  Allergen Reactions  . Statins Other (See Comments)    myalgias  . Esomeprazole Magnesium     REACTION: rash  . Morphine     REACTION: UNKNOWN REACTION     Current Outpatient Prescriptions  Medication Sig Dispense Refill  . albuterol (PROVENTIL HFA;VENTOLIN HFA) 108 (90 BASE) MCG/ACT inhaler Inhale 2 puffs into the lungs every 6 (six) hours as needed.        Marland Kitchen. aspirin 325 MG tablet Take 325 mg by mouth daily.      . cholecalciferol (VITAMIN D) 1000 UNITS tablet Take 2,000  Units by mouth daily.      . furosemide (LASIX) 40 MG tablet Take 1 tablet (40 mg total) by mouth 2 (two) times daily.  60 tablet  6  . glipiZIDE (GLUCOTROL) 5 MG tablet Take 5 mg by mouth 2 (two) times daily before a meal.      . ibuprofen (ADVIL,MOTRIN) 800 MG tablet Take 800 mg by mouth every 8 (eight) hours as needed. Rare      . ipratropium-albuterol (DUONEB) 0.5-2.5 (3) MG/3ML SOLN Take 3 mLs by nebulization every 6 (six) hours as needed.      . isosorbide mononitrate (IMDUR) 30 MG 24 hr tablet TAKE ONE TABLET TWICE DAILY  60 tablet  9  . lansoprazole (PREVACID) 30 MG capsule Take 1 capsule (30 mg total) by mouth daily.  31 capsule  11  . levothyroxine (SYNTHROID, LEVOTHROID) 75 MCG tablet Take 75 mcg by mouth daily.        Marland Kitchen lisinopril-hydrochlorothiazide (PRINZIDE,ZESTORETIC)  20-12.5 MG per tablet Take 1 tablet by mouth 2 (two) times daily.        . metoprolol succinate (TOPROL-XL) 100 MG 24 hr tablet TAKE ONE MG IN THE AM AND 5OMG IN THE PM  60 tablet  6  . montelukast (SINGULAIR) 10 MG tablet Take 10 mg by mouth at bedtime.        Marland Kitchen NITROSTAT 0.4 MG SL tablet as needed.      . nystatin-triamcinolone (MYCOLOG II) cream Apply 1 application topically 2 (two) times daily as needed.      . tolterodine (DETROL LA) 4 MG 24 hr capsule Take 4 mg by mouth daily.        . traMADol (ULTRAM) 50 MG tablet Take 50 mg by mouth every 6 (six) hours as needed. Maximum dose= 8 tablets per day        No current facility-administered medications for this visit.     Past Surgical History  Procedure Laterality Date  . Tubal ligation    . Partial hysterectomy    . Cholecystectomy    . Abdominal hernia repair      x 3  . Hip surgery      left  . Ercp w/ sphincterotomy and balloon dilation    . Colonoscopy  05/29/2008    Dr. Jena Gauss diverticulosis, hemorrhoids, 2 hyperplastic polyps  . Cardiac catheterization  05/28/07    normal LV fcn, 30% narrowing in osteum of RCA, 40% narrowig in ostium of LRA     Allergies  Allergen Reactions  . Statins Other (See Comments)    myalgias  . Esomeprazole Magnesium     REACTION: rash  . Morphine     REACTION: UNKNOWN REACTION      Family History  Problem Relation Age of Onset  . Colon cancer Brother 89  . Lung cancer Brother   . Cancer Sister     bladder  . Lung cancer Father      Social History Angela [name] reports that she has never smoked. She does not have any smokeless tobacco history on file. Angela [name] reports that she does not drink alcohol.   Review of Systems CONSTITUTIONAL: No weight loss, fever, chills, weakness or fatigue.  HEENT: Eyes: No visual loss, blurred vision, double vision or yellow sclerae.No hearing loss, sneezing, congestion, runny nose or sore throat.  SKIN: No rash or itching.  CARDIOVASCULAR:    RESPIRATORY: No shortness of breath, cough or sputum.  GASTROINTESTINAL: No anorexia, nausea, vomiting or diarrhea. No abdominal  pain or blood.  GENITOURINARY: No burning on urination, no polyuria NEUROLOGICAL: No headache, dizziness, syncope, paralysis, ataxia, numbness or tingling in the extremities. No change in bowel or bladder control.  MUSCULOSKELETAL: No muscle, back pain, joint pain or stiffness.  LYMPHATICS: No enlarged nodes. No history of splenectomy.  PSYCHIATRIC: No history of depression or anxiety.  ENDOCRINOLOGIC: No reports of sweating, cold or heat intolerance. No polyuria or polydipsia.  Marland Kitchen   Physical Examination There were no vitals filed for this visit. There were no vitals filed for this visit.  Gen: resting comfortably, no acute distress HEENT: no scleral icterus, pupils equal round and reactive, no palptable cervical adenopathy,  CV Resp: Clear to auscultation bilaterally GI: abdomen is soft, non-tender, non-distended, normal bowel sounds, no hepatosplenomegaly MSK: extremities are warm, no edema.  Skin: warm, no rash Neuro:  no focal deficits Psych: appropriate affect   Diagnostic Studies 10/2013 Echo  LVEF 60-65%, mild LVH, grade I diasotlic dysfunction,   10/2013 Event monitor  No symptoms, no arrhythmias.    12/2013 Holter 48 hrs NSR, occasional PVCs, no NSVT, no significant arrythmias.  12/2013 Carotid US IMPRESSION: No significant carotid stenosis identified. Mild amount of plaque present on the right with estimated right ICA stenosis of less than 50%. There is no evidence of left ICA stenosis.    Assessment and Plan  1. Non-obstructive CAD: no new symptoms, over 10 years of chest pain w/ negative cardiac caths and stress tests. Likely non-cardiac chest pain, vs coronary spasm or myocardial bridging (though no convinving evidence on cath) Normal LVEF.  - continue current medications   2. Palpitations  - she had difficulty operating the  event monitor, also reports she was mainly bed bound with a flu like illness and thus was not performing her normal activities and did not have significant symptoms  - will obtain 48 hr holter monitor  - seems to be related to thinking about her husbands recent passing  3. Carotid bruit    Antoine Poche, M.D., F.A.C.C.

## 2014-03-17 ENCOUNTER — Encounter: Payer: Self-pay | Admitting: Cardiology

## 2014-03-17 ENCOUNTER — Ambulatory Visit (INDEPENDENT_AMBULATORY_CARE_PROVIDER_SITE_OTHER): Payer: Medicare Other | Admitting: Cardiology

## 2014-03-17 VITALS — BP 154/59 | HR 80 | Ht 62.0 in | Wt 194.0 lb

## 2014-03-17 DIAGNOSIS — R002 Palpitations: Secondary | ICD-10-CM

## 2014-03-17 DIAGNOSIS — R079 Chest pain, unspecified: Secondary | ICD-10-CM

## 2014-03-17 MED ORDER — LISINOPRIL 40 MG PO TABS: 40.0000 mg | ORAL_TABLET | Freq: Every day | ORAL | Status: AC

## 2014-03-17 NOTE — Progress Notes (Signed)
Clinical Summary Ms. Pardee is a [age] y.o.female seen today for follow up of the following medical problems. This is a focused visit on her history of chest pain and palpitations, for more detailed visit refer to my prior clinic notes.   1.Non-obstructive CAD:  -cath 2008: LM nl, 10% prox LAD, the mid LAD dipped intramyocardially but no bridging was demonstrated. RCA 30% prox. Left renal 40%  - negative nuclear perfusion scan Sept 2013  - 10/2013 Echo LVEF 60-65%  - reports intermittent chest pain for several years, previously unremarkable caths w/ recent negative stress test.  - since last visit she denies any episodes of chest pain.    2. Palpitations  - denies any recent symptoms - prior monitors did not show any significant arrhythmias - from follow up visits, it seems these symptoms were mainly associated with anxiety/stress and emotions of the recent passing of her husband. She seems to be doing better in progressing in the grieving process and her symptoms have improved.    Past Medical History  Diagnosis Date  . GERD (gastroesophageal reflux disease)   . Family hx of colon cancer   . Sphincter of Oddi spasm     Status post ERCP with sphincterotomy for SOD versus papillary stenosis  . Diverticulosis of colon   . Helicobacter pylori gastritis     s/p treatment?  . Gastroparesis 2005    Abnormal GS  . Heart disease, unspecified   . Osteoarthritis   . IBS (irritable bowel syndrome)   . Osteopenia   . Goiter     Dr Emmit Pomfretarol Wolicki  . Hypothyroid   . COPD (chronic obstructive pulmonary disease)   . Asthma   . Hypercholesterolemia   . HTN (hypertension)   . DM (diabetes mellitus)   . Fatty liver     Insetting of normal LFTs  . History of adenomatous polyp of colon   . Renal artery stenosis 07/06/12    R renal artery 60-99% diameter reduction PSV 336, EBV 107; >60% narrowing in celiac SMA and IMA  . Pulmonary nodules     Multiple, had previously been followed by CT,  stable  . Chest pain     echo 01/29/11 - mild dilation of LA, mild diastolic dysfunction; cath 05/2007 - non-obstructive CAD 10% in LAD , 20-30% in RCA  . Dyspnea 07/30/12    stress test - normal perfusion all regions, EF 67%, no sig change from last study 2012  . Lower extremity edema 01/07/11    doppler -no evidence of thrombus or insufficiency     Allergies  Allergen Reactions  . Statins Other (See Comments)    myalgias  . Esomeprazole Magnesium     REACTION: rash  . Morphine     REACTION: UNKNOWN REACTION     Current Outpatient Prescriptions  Medication Sig Dispense Refill  . albuterol (PROVENTIL HFA;VENTOLIN HFA) 108 (90 BASE) MCG/ACT inhaler Inhale 2 puffs into the lungs every 6 (six) hours as needed.        Marland Kitchen. aspirin 325 MG tablet Take 325 mg by mouth daily.      . cholecalciferol (VITAMIN D) 1000 UNITS tablet Take 2,000 Units by mouth daily.      . furosemide (LASIX) 40 MG tablet Take 1 tablet (40 mg total) by mouth 2 (two) times daily.  60 tablet  6  . glipiZIDE (GLUCOTROL) 5 MG tablet Take 5 mg by mouth 2 (two) times daily before a meal.      .  ibuprofen (ADVIL,MOTRIN) 800 MG tablet Take 800 mg by mouth every 8 (eight) hours as needed. Rare      . ipratropium-albuterol (DUONEB) 0.5-2.5 (3) MG/3ML SOLN Take 3 mLs by nebulization every 6 (six) hours as needed.      . isosorbide mononitrate (IMDUR) 30 MG 24 hr tablet TAKE ONE TABLET TWICE DAILY  60 tablet  9  . lansoprazole (PREVACID) 30 MG capsule Take 1 capsule (30 mg total) by mouth daily.  31 capsule  11  . levothyroxine (SYNTHROID, LEVOTHROID) 75 MCG tablet Take 75 mcg by mouth daily.        Marland Kitchen lisinopril-hydrochlorothiazide (PRINZIDE,ZESTORETIC) 20-12.5 MG per tablet Take 1 tablet by mouth 2 (two) times daily.        . metoprolol succinate (TOPROL-XL) 100 MG 24 hr tablet TAKE ONE MG IN THE AM AND 5OMG IN THE PM  60 tablet  6  . montelukast (SINGULAIR) 10 MG tablet Take 10 mg by mouth at bedtime.        Marland Kitchen NITROSTAT 0.4 MG  SL tablet as needed.      . nystatin-triamcinolone (MYCOLOG II) cream Apply 1 application topically 2 (two) times daily as needed.      . tolterodine (DETROL LA) 4 MG 24 hr capsule Take 4 mg by mouth daily.        . traMADol (ULTRAM) 50 MG tablet Take 50 mg by mouth every 6 (six) hours as needed. Maximum dose= 8 tablets per day        No current facility-administered medications for this visit.     Past Surgical History  Procedure Laterality Date  . Tubal ligation    . Partial hysterectomy    . Cholecystectomy    . Abdominal hernia repair      x 3  . Hip surgery      left  . Ercp w/ sphincterotomy and balloon dilation    . Colonoscopy  05/29/2008    Dr. Jena Gauss diverticulosis, hemorrhoids, 2 hyperplastic polyps  . Cardiac catheterization  05/28/07    normal LV fcn, 30% narrowing in osteum of RCA, 40% narrowig in ostium of LRA     Allergies  Allergen Reactions  . Statins Other (See Comments)    myalgias  . Esomeprazole Magnesium     REACTION: rash  . Morphine     REACTION: UNKNOWN REACTION      Family History  Problem Relation Age of Onset  . Colon cancer Brother 87  . Lung cancer Brother   . Cancer Sister     bladder  . Lung cancer Father      Social History Ms. Pharris reports that she has never smoked. She does not have any smokeless tobacco history on file. Ms. Grounds reports that she does not drink alcohol.   Review of Systems CONSTITUTIONAL: No weight loss, fever, chills, weakness or fatigue.  HEENT: Eyes: No visual loss, blurred vision, double vision or yellow sclerae.No hearing loss, sneezing, congestion, runny nose or sore throat.  SKIN: No rash or itching.  CARDIOVASCULAR: per HPI RESPIRATORY: No shortness of breath, cough or sputum.  GASTROINTESTINAL: No anorexia, nausea, vomiting or diarrhea. No abdominal pain or blood.  GENITOURINARY: No burning on urination, no polyuria NEUROLOGICAL: No headache, dizziness, syncope, paralysis, ataxia, numbness  or tingling in the extremities. No change in bowel or bladder control.  MUSCULOSKELETAL: No muscle, back pain, joint pain or stiffness.  LYMPHATICS: No enlarged nodes. No history of splenectomy.  PSYCHIATRIC: No history of depression or  anxiety.  ENDOCRINOLOGIC: No reports of sweating, cold or heat intolerance. No polyuria or polydipsia.  Marland Kitchen.   Physical Examination p 80 bp 154/59 Wt 194 lbs BMI 35 Gen: resting comfortably, no acute distress HEENT: no scleral icterus, pupils equal round and reactive, no palptable cervical adenopathy,  CV: RRR, no m/r/g, no JVD Resp: Clear to auscultation bilaterally GI: abdomen is soft, non-tender, non-distended, normal bowel sounds, no hepatosplenomegaly MSK: extremities are warm, no edema.  Skin: warm, no rash Neuro:  no focal deficits Psych: appropriate affect   Diagnostic Studies 10/2013 Echo  LVEF 60-65%, mild LVH, grade I diasotlic dysfunction,   10/2013 Event monitor  No symptoms, no arrhythmias.   12/2013 Holter 48 hrs  NSR, occasional PVCs, no NSVT, no significant arrythmias.   12/2013 Carotid US  IMPRESSION: No significant carotid stenosis identified. Mild amount of plaque present on the right with estimated right ICA stenosis of less than 50%. There is no evidence of left ICA stenosis.  09/2011 CTA Abd/Pelvis Findings suspicious for an ostial SMA hemodynamically significant stenosis, estimated greater than 50% by CTA.  Mild less than 50% narrowing of the celiac origin.  Patent IMA.  Based on clinical symptoms, consider further evaluation with catheter angiography and possible SMA stent placement.  Previous cholecystectomy and hysterectomy.  Right adrenal nodule, probable adenoma.  Incidental duodenal diverticulum.  Colonic diverticulosis.  Small left ovarian cyst.     Assessment and Plan   1. Non-obstructive CAD: no new symptoms, over 10 years of chest pain w/ negative cardiac caths and stress tests. Likely  non-cardiac chest pain, vs coronary spasm or myocardial bridging (though no convinving evidence on cath) Normal LVEF.  - continue current medications   2. Palpitations  - no significant arrhythmias from recent testing, symptoms over time has proven to be mostly associated with stress/anxiety related to her husbands recent passing - continue to follow cliniclaly  3. HTN - bp above goal, will increase lisinpril to 40mg  daily     Antoine PocheJonathan F. Annel Zunker, M.D., F.A.C.C.

## 2014-03-17 NOTE — Patient Instructions (Signed)
Your physician recommends that you schedule a follow-up appointment in:  3 months with Dr Wyline MoodBranch  Your physician has recommended you make the following change in your medication:   Stop Lisinopril HCTZ STart Lisinopril 40 mg daily

## 2014-04-17 NOTE — Progress Notes (Signed)
This encounter was created in error - please disregard.

## 2014-07-27 ENCOUNTER — Ambulatory Visit (INDEPENDENT_AMBULATORY_CARE_PROVIDER_SITE_OTHER): Payer: Medicare Other | Admitting: Cardiology

## 2014-07-27 ENCOUNTER — Encounter: Payer: Medicare Other | Admitting: Cardiology

## 2014-07-27 ENCOUNTER — Encounter: Payer: Self-pay | Admitting: *Deleted

## 2014-07-27 ENCOUNTER — Encounter: Payer: Self-pay | Admitting: Cardiology

## 2014-07-27 VITALS — BP 148/78 | HR 60 | Ht 62.0 in | Wt 190.0 lb

## 2014-07-27 DIAGNOSIS — R002 Palpitations: Secondary | ICD-10-CM

## 2014-07-27 DIAGNOSIS — I714 Abdominal aortic aneurysm, without rupture, unspecified: Secondary | ICD-10-CM

## 2014-07-27 NOTE — Patient Instructions (Signed)
Your physician wants you to follow-up in: 6 months You will receive a reminder letter in the mail two months in advance. If you don't receive a letter, please call our office to schedule the follow-up appointment.  Your physician has recommended you make the following change in your medication:   DECREASE Aspirin to 81 mg daily    Your physician has requested that you have an abdominal aorta duplex. During this test, an ultrasound is used to evaluate the aorta. Allow 30 minutes for this exam. Do not eat after midnight the day before and avoid carbonated beverages

## 2014-07-27 NOTE — Progress Notes (Addendum)
Clinical Summary Ms. Baglio is a 80 y.o.female seen today for follow up of the following medical problems.  1.Non-obstructive CAD:  -cath 2008: LM nl, 10% prox LAD, the mid LAD dipped intramyocardially but no bridging was demonstrated. RCA 30% prox. Left renal 40%  - negative nuclear perfusion scan Sept 2013  - 10/2013 Echo LVEF 60-65%  - reports intermittent chest pain for several years, previously unremarkable caths w/ recent negative stress test.  - since last visit she denies any episodes of chest pain.   2. Palpitations  - notes some recent episodes of palpitations  - prior monitors did not show any significant arrhythmias  - previously her palpitations were mainly triggered by emotional stress of her husbands recent passing. She states these episodes are not - she does endorse compliance with her Toprol. She drinks 4 cups of coffee daily  Past Medical History  Diagnosis Date  . GERD (gastroesophageal reflux disease)   . Family hx of colon cancer   . Sphincter of Oddi spasm     Status post ERCP with sphincterotomy for SOD versus papillary stenosis  . Diverticulosis of colon   . Helicobacter pylori gastritis     s/p treatment?  . Gastroparesis 2005    Abnormal GS  . Heart disease, unspecified   . Osteoarthritis   . IBS (irritable bowel syndrome)   . Osteopenia   . Goiter     Dr Emmit Pomfret  . Hypothyroid   . COPD (chronic obstructive pulmonary disease)   . Asthma   . Hypercholesterolemia   . HTN (hypertension)   . DM (diabetes mellitus)   . Fatty liver     Insetting of normal LFTs  . History of adenomatous polyp of colon   . Renal artery stenosis 07/06/12    R renal artery 60-99% diameter reduction PSV 336, EBV 107; >60% narrowing in celiac SMA and IMA  . Pulmonary nodules     Multiple, had previously been followed by CT, stable  . Chest pain     echo 01/29/11 - mild dilation of LA, mild diastolic dysfunction; cath 05/2007 - non-obstructive CAD 10% in LAD ,  20-30% in RCA  . Dyspnea 07/30/12    stress test - normal perfusion all regions, EF 67%, no sig change from last study 2012  . Lower extremity edema 01/07/11    doppler -no evidence of thrombus or insufficiency     Allergies  Allergen Reactions  . Statins Other (See Comments)    myalgias  . Esomeprazole Magnesium     REACTION: rash  . Morphine     REACTION: UNKNOWN REACTION     Current Outpatient Prescriptions  Medication Sig Dispense Refill  . albuterol (PROVENTIL HFA;VENTOLIN HFA) 108 (90 BASE) MCG/ACT inhaler Inhale 2 puffs into the lungs every 6 (six) hours as needed.        Marland Kitchen aspirin 325 MG tablet Take 325 mg by mouth daily.      . cholecalciferol (VITAMIN D) 1000 UNITS tablet Take 2,000 Units by mouth daily.      . furosemide (LASIX) 40 MG tablet Take 1 tablet (40 mg total) by mouth 2 (two) times daily.  60 tablet  6  . glipiZIDE (GLUCOTROL) 5 MG tablet Take 5 mg by mouth 2 (two) times daily before a meal.      . ibuprofen (ADVIL,MOTRIN) 800 MG tablet Take 800 mg by mouth every 8 (eight) hours as needed. Rare      . ipratropium-albuterol (DUONEB)  0.5-2.5 (3) MG/3ML SOLN Take 3 mLs by nebulization every 6 (six) hours as needed.      . isosorbide mononitrate (IMDUR) 30 MG 24 hr tablet TAKE ONE TABLET TWICE DAILY  60 tablet  9  . lansoprazole (PREVACID) 30 MG capsule Take 1 capsule (30 mg total) by mouth daily.  31 capsule  11  . levothyroxine (SYNTHROID, LEVOTHROID) 75 MCG tablet Take 75 mcg by mouth daily.        Marland Kitchen lisinopril (PRINIVIL,ZESTRIL) 40 MG tablet Take 1 tablet (40 mg total) by mouth daily.  90 tablet  3  . metoprolol succinate (TOPROL-XL) 100 MG 24 hr tablet TAKE ONE MG IN THE AM AND 5OMG IN THE PM  60 tablet  6  . montelukast (SINGULAIR) 10 MG tablet Take 10 mg by mouth at bedtime.        Marland Kitchen NITROSTAT 0.4 MG SL tablet as needed.      . nystatin-triamcinolone (MYCOLOG II) cream Apply 1 application topically 2 (two) times daily as needed.      . tolterodine (DETROL LA)  4 MG 24 hr capsule Take 4 mg by mouth daily.        . traMADol (ULTRAM) 50 MG tablet Take 50 mg by mouth every 6 (six) hours as needed. Maximum dose= 8 tablets per day        No current facility-administered medications for this visit.     Past Surgical History  Procedure Laterality Date  . Tubal ligation    . Partial hysterectomy    . Cholecystectomy    . Abdominal hernia repair      x 3  . Hip surgery      left  . Ercp w/ sphincterotomy and balloon dilation    . Colonoscopy  05/29/2008    Dr. Jena Gauss diverticulosis, hemorrhoids, 2 hyperplastic polyps  . Cardiac catheterization  05/28/07    normal LV fcn, 30% narrowing in osteum of RCA, 40% narrowig in ostium of LRA     Allergies  Allergen Reactions  . Statins Other (See Comments)    myalgias  . Esomeprazole Magnesium     REACTION: rash  . Morphine     REACTION: UNKNOWN REACTION      Family History  Problem Relation Age of Onset  . Colon cancer Brother 58  . Lung cancer Brother   . Cancer Sister     bladder  . Lung cancer Father      Social History Ms. Nicholls reports that she has never smoked. She does not have any smokeless tobacco history on file. Ms. Petro reports that she does not drink alcohol.   Review of Systems CONSTITUTIONAL: No weight loss, fever, chills, weakness or fatigue.  HEENT: Eyes: No visual loss, blurred vision, double vision or yellow sclerae.No hearing loss, sneezing, congestion, runny nose or sore throat.  SKIN: No rash or itching.  CARDIOVASCULAR: per HPI RESPIRATORY: No shortness of breath, cough or sputum.  GASTROINTESTINAL: No anorexia, nausea, vomiting or diarrhea. No abdominal pain or blood.  GENITOURINARY: No burning on urination, no polyuria NEUROLOGICAL: No headache, dizziness, syncope, paralysis, ataxia, numbness or tingling in the extremities. No change in bowel or bladder control.  MUSCULOSKELETAL: No muscle, back pain, joint pain or stiffness.  LYMPHATICS: No enlarged  nodes. No history of splenectomy.  PSYCHIATRIC: No history of depression or anxiety.  ENDOCRINOLOGIC: No reports of sweating, cold or heat intolerance. No polyuria or polydipsia.  Marland Kitchen   Physical Examination p 60 bp 148/78 Wt 190  lbs BMI 34 Gen: resting comfortably, no acute distress HEENT: no scleral icterus, pupils equal round and reactive, no palptable cervical adenopathy,  CV: RRR, no m/r/g, no JVD, no carotid bruits Resp: Clear to auscultation bilaterally GI: abdomen is soft, non-tender, non-distended, normal bowel sounds, no hepatosplenomegaly MSK: extremities are warm, no edema.  Skin: warm, no rash Neuro:  no focal deficits Psych: appropriate affect   Diagnostic Studies 12/2013 Carotid US  IMPRESSION: No significant carotid stenosis identified. Mild amount of plaque present on the right with estimated right ICA stenosis of less than 50%. There is no evidence of left ICA stenosis.  09/2011 CTA Abd/Pelvis  Findings suspicious for an ostial SMA hemodynamically significant stenosis, estimated greater than 50% by CTA.  Mild less than 50% narrowing of the celiac origin.  Patent IMA.  Based on clinical symptoms, consider further evaluation with catheter angiography and possible SMA stent placement.  Previous cholecystectomy and hysterectomy.  Right adrenal nodule, probable adenoma.  Incidental duodenal diverticulum.  Colonic diverticulosis.  Small left ovarian cyst.   10/2013 Echo  LVEF 60-65%, mild LVH, grade I diasotlic dysfunction,   10/2013 Event monitor  No symptoms, no arrhythmias.   12/2013 Holter 48 hrs  NSR, occasional PVCs, no NSVT, no significant arrythmias.   07/2012 MPI No ischemia  07/27/14 EKG Sinus bradycardia Assessment and Plan  1. Non-obstructive CAD: no new symptoms, over 10 years of chest pain w/ negative cardiac caths and stress tests. Likely non-cardiac chest pain, vs coronary spasm or myocardial bridging (though no convinving evidence  on cath) Normal LVEF.  - continue current medications   2. Palpitations  - no significant arrhythmias from recent testing - EKG today shows sinsu bradycardia - endorses heavy caffeine intake, counseled to cut back on her coffee for now and continue following her symptoms. Hesitant to increase her Toprol as her HR today is 56  3. AAA - repeat US       Antoine Poche, M.D., F.A.C.C.

## 2014-07-27 NOTE — Progress Notes (Signed)
Clinical Summary Angela [name] is a 80 y.o.female seen today for follow up of the following medical problems.   1.Non-obstructive CAD:  -cath 2008: LM nl, 10% prox LAD, the mid LAD dipped intramyocardially but no bridging was demonstrated. RCA 30% prox. Left renal 40%  - negative nuclear perfusion scan Sept 2013  - 10/2013 Echo LVEF 60-65%  - reports intermittent chest pain for several years, previously unremarkable caths w/ recent negative stress test.  - since last visit she denies any episodes of chest pain.   2. Palpitations  - denies any recent symptoms  - prior monitors did not show any significant arrhythmias  - from follow up visits, it seems these symptoms were mainly associated with anxiety/stress and emotions of the recent passing of her husband. She seems to be doing better in progressing in the grieving process and her symptoms have improved.     Past Medical History  Diagnosis Date  . GERD (gastroesophageal reflux disease)   . Family hx of colon cancer   . Sphincter of Oddi spasm     Status post ERCP with sphincterotomy for SOD versus papillary stenosis  . Diverticulosis of colon   . Helicobacter pylori gastritis     s/p treatment?  . Gastroparesis 2005    Abnormal GS  . Heart disease, unspecified   . Osteoarthritis   . IBS (irritable bowel syndrome)   . Osteopenia   . Goiter     Dr Emmit Pomfret  . Hypothyroid   . COPD (chronic obstructive pulmonary disease)   . Asthma   . Hypercholesterolemia   . HTN (hypertension)   . DM (diabetes mellitus)   . Fatty liver     Insetting of normal LFTs  . History of adenomatous polyp of colon   . Renal artery stenosis 07/06/12    R renal artery 60-99% diameter reduction PSV 336, EBV 107; >60% narrowing in celiac SMA and IMA  . Pulmonary nodules     Multiple, had previously been followed by CT, stable  . Chest pain     echo 01/29/11 - mild dilation of LA, mild diastolic dysfunction; cath 05/2007 - non-obstructive CAD  10% in LAD , 20-30% in RCA  . Dyspnea 07/30/12    stress test - normal perfusion all regions, EF 67%, no sig change from last study 2012  . Lower extremity edema 01/07/11    doppler -no evidence of thrombus or insufficiency     Allergies  Allergen Reactions  . Statins Other (See Comments)    myalgias  . Esomeprazole Magnesium     REACTION: rash  . Morphine     REACTION: UNKNOWN REACTION     Current Outpatient Prescriptions  Medication Sig Dispense Refill  . albuterol (PROVENTIL HFA;VENTOLIN HFA) 108 (90 BASE) MCG/ACT inhaler Inhale 2 puffs into the lungs every 6 (six) hours as needed.        Marland Kitchen aspirin 325 MG tablet Take 325 mg by mouth daily.      . cholecalciferol (VITAMIN D) 1000 UNITS tablet Take 2,000 Units by mouth daily.      . furosemide (LASIX) 40 MG tablet Take 1 tablet (40 mg total) by mouth 2 (two) times daily.  60 tablet  6  . glipiZIDE (GLUCOTROL) 5 MG tablet Take 5 mg by mouth 2 (two) times daily before a meal.      . ibuprofen (ADVIL,MOTRIN) 800 MG tablet Take 800 mg by mouth every 8 (eight) hours as needed. Rare      .  ipratropium-albuterol (DUONEB) 0.5-2.5 (3) MG/3ML SOLN Take 3 mLs by nebulization every 6 (six) hours as needed.      . isosorbide mononitrate (IMDUR) 30 MG 24 hr tablet TAKE ONE TABLET TWICE DAILY  60 tablet  9  . lansoprazole (PREVACID) 30 MG capsule Take 1 capsule (30 mg total) by mouth daily.  31 capsule  11  . levothyroxine (SYNTHROID, LEVOTHROID) 75 MCG tablet Take 75 mcg by mouth daily.        Marland Kitchen lisinopril (PRINIVIL,ZESTRIL) 40 MG tablet Take 1 tablet (40 mg total) by mouth daily.  90 tablet  3  . metoprolol succinate (TOPROL-XL) 100 MG 24 hr tablet TAKE ONE MG IN THE AM AND 5OMG IN THE PM  60 tablet  6  . montelukast (SINGULAIR) 10 MG tablet Take 10 mg by mouth at bedtime.        Marland Kitchen NITROSTAT 0.4 MG SL tablet as needed.      . nystatin-triamcinolone (MYCOLOG II) cream Apply 1 application topically 2 (two) times daily as needed.      .  tolterodine (DETROL LA) 4 MG 24 hr capsule Take 4 mg by mouth daily.        . traMADol (ULTRAM) 50 MG tablet Take 50 mg by mouth every 6 (six) hours as needed. Maximum dose= 8 tablets per day        No current facility-administered medications for this visit.     Past Surgical History  Procedure Laterality Date  . Tubal ligation    . Partial hysterectomy    . Cholecystectomy    . Abdominal hernia repair      x 3  . Hip surgery      left  . Ercp w/ sphincterotomy and balloon dilation    . Colonoscopy  05/29/2008    Dr. Jena Gauss diverticulosis, hemorrhoids, 2 hyperplastic polyps  . Cardiac catheterization  05/28/07    normal LV fcn, 30% narrowing in osteum of RCA, 40% narrowig in ostium of LRA     Allergies  Allergen Reactions  . Statins Other (See Comments)    myalgias  . Esomeprazole Magnesium     REACTION: rash  . Morphine     REACTION: UNKNOWN REACTION      Family History  Problem Relation Age of Onset  . Colon cancer Brother 87  . Lung cancer Brother   . Cancer Sister     bladder  . Lung cancer Father      Social History Angela [name] reports that she has never smoked. She does not have any smokeless tobacco history on file. Angela [name] reports that she does not drink alcohol.   Review of Systems CONSTITUTIONAL: No weight loss, fever, chills, weakness or fatigue.  HEENT: Eyes: No visual loss, blurred vision, double vision or yellow sclerae.No hearing loss, sneezing, congestion, runny nose or sore throat.  SKIN: No rash or itching.  CARDIOVASCULAR:  RESPIRATORY: No shortness of breath, cough or sputum.  GASTROINTESTINAL: No anorexia, nausea, vomiting or diarrhea. No abdominal pain or blood.  GENITOURINARY: No burning on urination, no polyuria NEUROLOGICAL: No headache, dizziness, syncope, paralysis, ataxia, numbness or tingling in the extremities. No change in bowel or bladder control.  MUSCULOSKELETAL: No muscle, back pain, joint pain or stiffness.    LYMPHATICS: No enlarged nodes. No history of splenectomy.  PSYCHIATRIC: No history of depression or anxiety.  ENDOCRINOLOGIC: No reports of sweating, cold or heat intolerance. No polyuria or polydipsia.  Marland Kitchen   Physical Examination There were no vitals  filed for this visit. There were no vitals filed for this visit.  Gen: resting comfortably, no acute distress HEENT: no scleral icterus, pupils equal round and reactive, no palptable cervical adenopathy,  CV Resp: Clear to auscultation bilaterally GI: abdomen is soft, non-tender, non-distended, normal bowel sounds, no hepatosplenomegaly MSK: extremities are warm, no edema.  Skin: warm, no rash Neuro:  no focal deficits Psych: appropriate affect   Diagnostic Studies 12/2013 Carotid US  IMPRESSION: No significant carotid stenosis identified. Mild amount of plaque present on the right with estimated right ICA stenosis of less than 50%. There is no evidence of left ICA stenosis.  09/2011 CTA Abd/Pelvis  Findings suspicious for an ostial SMA hemodynamically significant stenosis, estimated greater than 50% by CTA.  Mild less than 50% narrowing of the celiac origin.  Patent IMA.  Based on clinical symptoms, consider further evaluation with catheter angiography and possible SMA stent placement.  Previous cholecystectomy and hysterectomy.  Right adrenal nodule, probable adenoma.  Incidental duodenal diverticulum.  Colonic diverticulosis.  Small left ovarian cyst.   10/2013 Echo  LVEF 60-65%, mild LVH, grade I diasotlic dysfunction,  10/2013 Event monitor  No symptoms, no arrhythmias.  12/2013 Holter 48 hrs  NSR, occasional PVCs, no NSVT, no significant arrythmias.       Assessment and Plan  1. Non-obstructive CAD: no new symptoms, over 10 years of chest pain w/ negative cardiac caths and stress tests. Likely non-cardiac chest pain, vs coronary spasm or myocardial bridging (though no convinving evidence on cath) Normal  LVEF.  - continue current medications   2. Palpitations  - no significant arrhythmias from recent testing, symptoms over time has proven to be mostly associated with stress/anxiety related to her husbands recent passing  - continue to follow cliniclaly   3. HTN  - bp above goal, will increase lisinpril to  daily       Antoine Poche, M.D., F.A.C.C.

## 2014-07-28 NOTE — Addendum Note (Signed)
Addended by: Marlyn Corporal A on: 07/28/2014 12:04 PM   Modules accepted: Orders

## 2014-08-03 ENCOUNTER — Ambulatory Visit (HOSPITAL_COMMUNITY)
Admission: RE | Admit: 2014-08-03 | Discharge: 2014-08-03 | Disposition: A | Discharge status: Home / Self Care | Payer: Medicare Other | Source: Ambulatory Visit | Attending: Cardiology | Admitting: Cardiology

## 2014-08-03 ENCOUNTER — Telehealth: Payer: Self-pay | Admitting: *Deleted

## 2014-08-03 DIAGNOSIS — I714 Abdominal aortic aneurysm, without rupture, unspecified: Secondary | ICD-10-CM

## 2014-08-03 NOTE — Telephone Encounter (Signed)
Yes, we can provide documentation for her that she is not able to take trash cans to the corner.   Dominga Ferry MD

## 2014-08-03 NOTE — Telephone Encounter (Signed)
Pt needs Dr Wyline Mood to give her a letter stating that she can not go to the end of her drive way to take the trash to the street.  She states she falls all the time because it is a hill. If she gives the city a letter they will come down her drive and pick it up from the house/tmj

## 2014-08-03 NOTE — Telephone Encounter (Signed)
Is it ok with you that I write a letter and you will sign when back in office next week ?

## 2014-10-12 ENCOUNTER — Inpatient Hospital Stay (HOSPITAL_COMMUNITY)
Admission: EM | Admit: 2014-10-12 | Discharge: 2014-10-16 | DRG: 603 | Disposition: A | Discharge status: Home / Self Care | Payer: Medicare Other | Attending: Family Medicine | Admitting: Family Medicine

## 2014-10-12 ENCOUNTER — Emergency Department (HOSPITAL_COMMUNITY): Payer: Medicare Other

## 2014-10-12 ENCOUNTER — Encounter (HOSPITAL_COMMUNITY): Payer: Self-pay

## 2014-10-12 DIAGNOSIS — Z7982 Long term (current) use of aspirin: Secondary | ICD-10-CM

## 2014-10-12 DIAGNOSIS — J45909 Unspecified asthma, uncomplicated: Secondary | ICD-10-CM | POA: Diagnosis not present

## 2014-10-12 DIAGNOSIS — K2100 Gastro-esophageal reflux disease with esophagitis, without bleeding: Secondary | ICD-10-CM

## 2014-10-12 DIAGNOSIS — Z86010 Personal history of colonic polyps: Secondary | ICD-10-CM | POA: Diagnosis not present

## 2014-10-12 DIAGNOSIS — L02419 Cutaneous abscess of limb, unspecified: Secondary | ICD-10-CM | POA: Diagnosis present

## 2014-10-12 DIAGNOSIS — I872 Venous insufficiency (chronic) (peripheral): Secondary | ICD-10-CM | POA: Diagnosis not present

## 2014-10-12 DIAGNOSIS — E039 Hypothyroidism, unspecified: Secondary | ICD-10-CM | POA: Diagnosis present

## 2014-10-12 DIAGNOSIS — I82409 Acute embolism and thrombosis of unspecified deep veins of unspecified lower extremity: Secondary | ICD-10-CM

## 2014-10-12 DIAGNOSIS — E038 Other specified hypothyroidism: Secondary | ICD-10-CM

## 2014-10-12 DIAGNOSIS — E669 Obesity, unspecified: Secondary | ICD-10-CM | POA: Diagnosis not present

## 2014-10-12 DIAGNOSIS — K219 Gastro-esophageal reflux disease without esophagitis: Secondary | ICD-10-CM | POA: Diagnosis present

## 2014-10-12 DIAGNOSIS — E7800 Pure hypercholesterolemia, unspecified: Secondary | ICD-10-CM

## 2014-10-12 DIAGNOSIS — Z9049 Acquired absence of other specified parts of digestive tract: Secondary | ICD-10-CM | POA: Diagnosis present

## 2014-10-12 DIAGNOSIS — Z888 Allergy status to other drugs, medicaments and biological substances status: Secondary | ICD-10-CM | POA: Diagnosis not present

## 2014-10-12 DIAGNOSIS — E876 Hypokalemia: Secondary | ICD-10-CM | POA: Diagnosis present

## 2014-10-12 DIAGNOSIS — I10 Essential (primary) hypertension: Secondary | ICD-10-CM | POA: Diagnosis not present

## 2014-10-12 DIAGNOSIS — Z79899 Other long term (current) drug therapy: Secondary | ICD-10-CM | POA: Diagnosis not present

## 2014-10-12 DIAGNOSIS — E1165 Type 2 diabetes mellitus with hyperglycemia: Secondary | ICD-10-CM | POA: Diagnosis not present

## 2014-10-12 DIAGNOSIS — I2510 Atherosclerotic heart disease of native coronary artery without angina pectoris: Secondary | ICD-10-CM | POA: Diagnosis not present

## 2014-10-12 DIAGNOSIS — K3184 Gastroparesis: Secondary | ICD-10-CM | POA: Diagnosis present

## 2014-10-12 DIAGNOSIS — R739 Hyperglycemia, unspecified: Secondary | ICD-10-CM

## 2014-10-12 DIAGNOSIS — I519 Heart disease, unspecified: Secondary | ICD-10-CM | POA: Diagnosis not present

## 2014-10-12 DIAGNOSIS — L03115 Cellulitis of right lower limb: Secondary | ICD-10-CM | POA: Diagnosis present

## 2014-10-12 DIAGNOSIS — Z6835 Body mass index (BMI) 35.0-35.9, adult: Secondary | ICD-10-CM | POA: Diagnosis not present

## 2014-10-12 DIAGNOSIS — R531 Weakness: Secondary | ICD-10-CM

## 2014-10-12 DIAGNOSIS — J449 Chronic obstructive pulmonary disease, unspecified: Secondary | ICD-10-CM | POA: Diagnosis not present

## 2014-10-12 DIAGNOSIS — E034 Atrophy of thyroid (acquired): Secondary | ICD-10-CM

## 2014-10-12 DIAGNOSIS — M79604 Pain in right leg: Secondary | ICD-10-CM

## 2014-10-12 DIAGNOSIS — A419 Sepsis, unspecified organism: Secondary | ICD-10-CM

## 2014-10-12 DIAGNOSIS — L03119 Cellulitis of unspecified part of limb: Secondary | ICD-10-CM | POA: Diagnosis present

## 2014-10-12 LAB — CBC WITH DIFFERENTIAL/PLATELET
Basophils Absolute: 0 10*3/uL (ref 0.0–0.1)
Basophils Relative: 0 % (ref 0–1)
EOS ABS: 0 10*3/uL (ref 0.0–0.7)
Eosinophils Relative: 0 % (ref 0–5)
HEMATOCRIT: 40.2 % (ref 36.0–46.0)
HEMOGLOBIN: 14.4 g/dL (ref 12.0–15.0)
Lymphocytes Relative: 7 % — ABNORMAL LOW (ref 12–46)
Lymphs Abs: 0.9 10*3/uL (ref 0.7–4.0)
MCH: 30.2 pg (ref 26.0–34.0)
MCHC: 35.8 g/dL (ref 30.0–36.0)
MCV: 84.3 fL (ref 78.0–100.0)
MONOS PCT: 5 % (ref 3–12)
Monocytes Absolute: 0.7 10*3/uL (ref 0.1–1.0)
Neutro Abs: 12.3 10*3/uL — ABNORMAL HIGH (ref 1.7–7.7)
Neutrophils Relative %: 88 % — ABNORMAL HIGH (ref 43–77)
Platelets: 165 10*3/uL (ref 150–400)
RBC: 4.77 MIL/uL (ref 3.87–5.11)
RDW: 12.8 % (ref 11.5–15.5)
Smear Review: ADEQUATE
WBC: 14 10*3/uL — ABNORMAL HIGH (ref 4.0–10.5)

## 2014-10-12 LAB — GLUCOSE, CAPILLARY
GLUCOSE-CAPILLARY: 258 mg/dL — AB (ref 70–99)
GLUCOSE-CAPILLARY: 259 mg/dL — AB (ref 70–99)
Glucose-Capillary: 181 mg/dL — ABNORMAL HIGH (ref 70–99)
Glucose-Capillary: 268 mg/dL — ABNORMAL HIGH (ref 70–99)

## 2014-10-12 LAB — BASIC METABOLIC PANEL
Anion gap: 18 — ABNORMAL HIGH (ref 5–15)
BUN: 24 mg/dL — AB (ref 6–23)
CHLORIDE: 97 meq/L (ref 96–112)
CO2: 22 mEq/L (ref 19–32)
Calcium: 9.2 mg/dL (ref 8.4–10.5)
Creatinine, Ser: 0.7 mg/dL (ref 0.50–1.10)
GFR calc Af Amer: >90 mL/min (ref >90)
GFR calc non Af Amer: 80 mL/min — ABNORMAL LOW (ref >90)
Glucose, Bld: 281 mg/dL — ABNORMAL HIGH (ref 70–99)
POTASSIUM: 3.7 meq/L (ref 3.7–5.3)
Sodium: 137 mEq/L (ref 137–147)

## 2014-10-12 LAB — I-STAT CG4 LACTIC ACID, ED: LACTIC ACID, VENOUS: 2.87 mmol/L — AB (ref 0.5–2.2)

## 2014-10-12 LAB — HEMOGLOBIN A1C
Hgb A1c MFr Bld: 8.4 % — ABNORMAL HIGH (ref <5.7)
Mean Plasma Glucose: 194 mg/dL — ABNORMAL HIGH (ref <117)

## 2014-10-12 MED ORDER — SODIUM CHLORIDE 0.9 % IV BOLUS (SEPSIS)
500.0000 mL | Freq: Once | INTRAVENOUS | Status: AC
  Administered 2014-10-12: 500 mL via INTRAVENOUS

## 2014-10-12 MED ORDER — FUROSEMIDE 40 MG PO TABS
40.0000 mg | ORAL_TABLET | Freq: Two times a day (BID) | ORAL | Status: DC
  Administered 2014-10-12 – 2014-10-15 (×7): 40 mg via ORAL
  Filled 2014-10-12 (×7): qty 1

## 2014-10-12 MED ORDER — SODIUM CHLORIDE 0.9 % IV SOLN
Freq: Once | INTRAVENOUS | Status: AC
Start: 2014-10-12 — End: 2014-10-12
  Administered 2014-10-12: 07:00:00 via INTRAVENOUS

## 2014-10-12 MED ORDER — ONDANSETRON HCL 4 MG PO TABS: 4.0000 mg | ORAL_TABLET | Freq: Four times a day (QID) | ORAL | Status: DC | PRN

## 2014-10-12 MED ORDER — ONDANSETRON HCL 4 MG/2ML IJ SOLN: 4.0000 mg | Freq: Three times a day (TID) | INTRAMUSCULAR | Status: AC | PRN

## 2014-10-12 MED ORDER — ASPIRIN EC 81 MG PO TBEC
81.0000 mg | DELAYED_RELEASE_TABLET | Freq: Every day | ORAL | Status: DC
Start: 2014-10-12 — End: 2014-10-16
  Administered 2014-10-12 – 2014-10-16 (×5): 81 mg via ORAL
  Filled 2014-10-12 (×5): qty 1

## 2014-10-12 MED ORDER — METOPROLOL SUCCINATE ER 50 MG PO TB24
100.0000 mg | ORAL_TABLET | Freq: Every day | ORAL | Status: DC
  Administered 2014-10-12 – 2014-10-16 (×5): 100 mg via ORAL
  Filled 2014-10-12 (×5): qty 2

## 2014-10-12 MED ORDER — IPRATROPIUM-ALBUTEROL 0.5-2.5 (3) MG/3ML IN SOLN: 3.0000 mL | Freq: Four times a day (QID) | RESPIRATORY_TRACT | Status: DC | PRN

## 2014-10-12 MED ORDER — CLINDAMYCIN PHOSPHATE 600 MG/50ML IV SOLN
600.0000 mg | Freq: Once | INTRAVENOUS | Status: AC
  Administered 2014-10-12: 600 mg via INTRAVENOUS
  Filled 2014-10-12: qty 50

## 2014-10-12 MED ORDER — INFLUENZA VAC SPLIT QUAD 0.5 ML IM SUSY
0.5000 mL | PREFILLED_SYRINGE | INTRAMUSCULAR | Status: AC
  Administered 2014-10-13: 0.5 mL via INTRAMUSCULAR
  Filled 2014-10-12: qty 0.5

## 2014-10-12 MED ORDER — NITROGLYCERIN 0.4 MG SL SUBL: 0.4000 mg | SUBLINGUAL_TABLET | SUBLINGUAL | Status: DC | PRN

## 2014-10-12 MED ORDER — FESOTERODINE FUMARATE ER 4 MG PO TB24
4.0000 mg | ORAL_TABLET | Freq: Every day | ORAL | Status: DC
  Administered 2014-10-12 – 2014-10-16 (×5): 4 mg via ORAL
  Filled 2014-10-12 (×7): qty 1

## 2014-10-12 MED ORDER — LEVOTHYROXINE SODIUM 75 MCG PO TABS
75.0000 ug | ORAL_TABLET | Freq: Every day | ORAL | Status: DC
  Administered 2014-10-12 – 2014-10-16 (×5): 75 ug via ORAL
  Filled 2014-10-12 (×5): qty 1

## 2014-10-12 MED ORDER — VITAMIN D 1000 UNITS PO TABS
2000.0000 [IU] | ORAL_TABLET | Freq: Every day | ORAL | Status: DC
  Administered 2014-10-12 – 2014-10-16 (×5): 2000 [IU] via ORAL
  Filled 2014-10-12 (×6): qty 2

## 2014-10-12 MED ORDER — MONTELUKAST SODIUM 10 MG PO TABS
10.0000 mg | ORAL_TABLET | Freq: Every day | ORAL | Status: DC
  Administered 2014-10-12 – 2014-10-15 (×4): 10 mg via ORAL
  Filled 2014-10-12 (×4): qty 1

## 2014-10-12 MED ORDER — ACETAMINOPHEN 650 MG RE SUPP: 650.0000 mg | Freq: Four times a day (QID) | RECTAL | Status: DC | PRN

## 2014-10-12 MED ORDER — ACETAMINOPHEN 325 MG PO TABS: 650.0000 mg | ORAL_TABLET | Freq: Four times a day (QID) | ORAL | Status: DC | PRN

## 2014-10-12 MED ORDER — PIPERACILLIN-TAZOBACTAM 3.375 G IVPB 30 MIN
3.3750 g | Freq: Once | INTRAVENOUS | Status: DC
  Filled 2014-10-12: qty 50

## 2014-10-12 MED ORDER — ALBUTEROL SULFATE (2.5 MG/3ML) 0.083% IN NEBU: 2.5000 mg | INHALATION_SOLUTION | Freq: Four times a day (QID) | RESPIRATORY_TRACT | Status: DC | PRN

## 2014-10-12 MED ORDER — PIPERACILLIN-TAZOBACTAM 3.375 G IVPB
3.3750 g | Freq: Three times a day (TID) | INTRAVENOUS | Status: DC
Start: 2014-10-12 — End: 2014-10-13
  Administered 2014-10-12 – 2014-10-13 (×4): 3.375 g via INTRAVENOUS
  Filled 2014-10-12 (×13): qty 50

## 2014-10-12 MED ORDER — ONDANSETRON HCL 4 MG/2ML IJ SOLN: 4.0000 mg | Freq: Four times a day (QID) | INTRAMUSCULAR | Status: DC | PRN

## 2014-10-12 MED ORDER — ISOSORBIDE MONONITRATE ER 30 MG PO TB24
30.0000 mg | ORAL_TABLET | Freq: Two times a day (BID) | ORAL | Status: DC
  Administered 2014-10-12 – 2014-10-16 (×9): 30 mg via ORAL
  Filled 2014-10-12 (×10): qty 1

## 2014-10-12 MED ORDER — TRAMADOL HCL 50 MG PO TABS
50.0000 mg | ORAL_TABLET | Freq: Four times a day (QID) | ORAL | Status: DC | PRN
  Administered 2014-10-12: 50 mg via ORAL
  Filled 2014-10-12: qty 1

## 2014-10-12 MED ORDER — VANCOMYCIN HCL IN DEXTROSE 1-5 GM/200ML-% IV SOLN
1000.0000 mg | Freq: Once | INTRAVENOUS | Status: AC
  Administered 2014-10-12: 1000 mg via INTRAVENOUS
  Filled 2014-10-12: qty 200

## 2014-10-12 MED ORDER — HYDROMORPHONE HCL 1 MG/ML IJ SOLN
0.5000 mg | Freq: Once | INTRAMUSCULAR | Status: AC
  Administered 2014-10-12: 0.5 mg via INTRAVENOUS
  Filled 2014-10-12: qty 1

## 2014-10-12 MED ORDER — SODIUM CHLORIDE 0.9 % IV SOLN
1500.0000 mg | INTRAVENOUS | Status: DC
  Administered 2014-10-12 – 2014-10-15 (×4): 1500 mg via INTRAVENOUS
  Filled 2014-10-12 (×4): qty 1500

## 2014-10-12 MED ORDER — KETOROLAC TROMETHAMINE 15 MG/ML IJ SOLN
15.0000 mg | Freq: Four times a day (QID) | INTRAMUSCULAR | Status: DC | PRN
  Administered 2014-10-12 – 2014-10-13 (×2): 15 mg via INTRAVENOUS
  Filled 2014-10-12 (×2): qty 1

## 2014-10-12 MED ORDER — SODIUM CHLORIDE 0.9 % IJ SOLN
3.0000 mL | Freq: Two times a day (BID) | INTRAMUSCULAR | Status: DC
  Administered 2014-10-13 – 2014-10-16 (×5): 3 mL via INTRAVENOUS

## 2014-10-12 MED ORDER — LISINOPRIL 10 MG PO TABS
40.0000 mg | ORAL_TABLET | Freq: Every day | ORAL | Status: DC
  Administered 2014-10-12 – 2014-10-16 (×5): 40 mg via ORAL
  Filled 2014-10-12 (×5): qty 4

## 2014-10-12 MED ORDER — ALBUTEROL SULFATE HFA 108 (90 BASE) MCG/ACT IN AERS
2.0000 | INHALATION_SPRAY | Freq: Four times a day (QID) | RESPIRATORY_TRACT | Status: DC | PRN
  Filled 2014-10-12: qty 6.7

## 2014-10-12 MED ORDER — METOPROLOL SUCCINATE ER 100 MG PO TB24
100.0000 mg | ORAL_TABLET | Freq: Every day | ORAL | Status: DC
  Filled 2014-10-12 (×2): qty 1

## 2014-10-12 MED ORDER — INSULIN ASPART 100 UNIT/ML ~~LOC~~ SOLN
0.0000 [IU] | Freq: Every day | SUBCUTANEOUS | Status: DC
Start: 2014-10-12 — End: 2014-10-16
  Administered 2014-10-12: 3 [IU] via SUBCUTANEOUS
  Administered 2014-10-14: 2 [IU] via SUBCUTANEOUS

## 2014-10-12 MED ORDER — HEPARIN SODIUM (PORCINE) 5000 UNIT/ML IJ SOLN
5000.0000 [IU] | Freq: Three times a day (TID) | INTRAMUSCULAR | Status: DC
  Administered 2014-10-12 – 2014-10-16 (×12): 5000 [IU] via SUBCUTANEOUS
  Filled 2014-10-12 (×11): qty 1

## 2014-10-12 NOTE — ED Notes (Signed)
Pt states both of her lower legs have gotten more red and swollen in the past several days.  Pt states they also are more painful

## 2014-10-12 NOTE — Progress Notes (Signed)
ANTIBIOTIC CONSULT NOTE - INITIAL  Pharmacy Consult for Vancomycin & Zosyn Indication: cellulitis  Allergies  Allergen Reactions  . Statins Other (See Comments)    myalgias  . Esomeprazole Magnesium     REACTION: rash  . Morphine     REACTION: UNKNOWN REACTION    Patient Measurements: Height: 5\' 1"  (154.9 cm) (5'1") Weight: 190 lb (86.183 kg) IBW/kg (Calculated) : 47.8 Adjusted Body Weight:   Vital Signs: Temp: 99.5 F (37.5 C) (11/26 0846) Temp Source: Oral (11/26 0409) BP: 128/49 mmHg (11/26 0846) Pulse Rate: 74 (11/26 0846) Intake/Output from previous day:   Intake/Output from this shift:    Labs:  Recent Labs  10/12/14 0400  WBC 14.0*  HGB 14.4  PLT 165  CREATININE 0.70   Estimated Creatinine Clearance: 56.9 mL/min (by C-G formula based on Cr of 0.7). No results for input(s): VANCOTROUGH, VANCOPEAK, VANCORANDOM, GENTTROUGH, GENTPEAK, GENTRANDOM, TOBRATROUGH, TOBRAPEAK, TOBRARND, AMIKACINPEAK, AMIKACINTROU, AMIKACIN in the last 72 hours.   Microbiology: Recent Results (from the past 720 hour(s))  Blood culture (routine x 2)     Status: None (Preliminary result)   Collection Time: 10/12/14  4:00 AM  Result Value Ref Range Status   Specimen Description BLOOD RIGHT HAND  Final   Special Requests   Final    BOTTLES DRAWN AEROBIC AND ANAEROBIC AEB 8CC ANA 6CC   Culture NO GROWTH <24 HRS  Final   Report Status PENDING  Incomplete  Blood culture (routine x 2)     Status: None (Preliminary result)   Collection Time: 10/12/14  4:05 AM  Result Value Ref Range Status   Specimen Description BLOOD LEFT ANTECUBITAL  Final   Special Requests BOTTLES DRAWN AEROBIC AND ANAEROBIC 10CC EACH  Final   Culture NO GROWTH <24 HRS  Final   Report Status PENDING  Incomplete    Medical History: Past Medical History  Diagnosis Date  . GERD (gastroesophageal reflux disease)   . Family hx of colon cancer   . Sphincter of Oddi spasm     Status post ERCP with sphincterotomy  for SOD versus papillary stenosis  . Diverticulosis of colon   . Helicobacter pylori gastritis     s/p treatment?  . Gastroparesis 2005    Abnormal GS  . Heart disease, unspecified   . Osteoarthritis   . IBS (irritable bowel syndrome)   . Osteopenia   . Goiter     Dr Emmit Pomfretarol Wolicki  . Hypothyroid   . COPD (chronic obstructive pulmonary disease)   . Asthma   . Hypercholesterolemia   . HTN (hypertension)   . DM (diabetes mellitus)   . Fatty liver     Insetting of normal LFTs  . History of adenomatous polyp of colon   . Renal artery stenosis 07/06/12    R renal artery 60-99% diameter reduction PSV 336, EBV 107; >60% narrowing in celiac SMA and IMA  . Pulmonary nodules     Multiple, had previously been followed by CT, stable  . Chest pain     echo 01/29/11 - mild dilation of LA, mild diastolic dysfunction; cath 05/2007 - non-obstructive CAD 10% in LAD , 20-30% in RCA  . Dyspnea 07/30/12    stress test - normal perfusion all regions, EF 67%, no sig change from last study 2012  . Lower extremity edema 01/07/11    doppler -no evidence of thrombus or insufficiency    Medications:  Scheduled:  . aspirin EC  81 mg Oral Daily  . cholecalciferol  2,000 Units Oral Daily  . fesoterodine  4 mg Oral Daily  . furosemide  40 mg Oral BID  . heparin  5,000 Units Subcutaneous 3 times per day  . insulin aspart  0-5 Units Subcutaneous QHS  . isosorbide mononitrate  30 mg Oral BID  . levothyroxine  75 mcg Oral QAC breakfast  . lisinopril  40 mg Oral Daily  . metoprolol succinate  100 mg Oral Daily  . montelukast  10 mg Oral QHS  . piperacillin-tazobactam (ZOSYN)  IV  3.375 g Intravenous Q8H  . sodium chloride  3 mL Intravenous Q12H  . vancomycin  1,500 mg Intravenous Q24H   Assessment: 80 y.o. Female presenting to ED with worsening RLE cellulitis over the shin. Patient reports using topical antifungals without significant improvement Afebrile. Vancomycin 1 GM, Zosyn 3.375 GM IV ordered in  ED   Goal of Therapy:  Vancomycin trough level 10-15 mcg/ml  Plan:  Vancomycin 1500 mg IV every 24 hours, next dose 6 PM today Zosyn 3.375 GM IV every 8 hours, infuse each dose over 4 hours Vancomycin trough at steady state Labs per protocol  Raquel JamesPittman, Jadd Gasior Bennett 10/12/2014,9:00 AM

## 2014-10-12 NOTE — H&P (Signed)
[facility] History and Physical  Angela [name] ZOX:[id number] DOB: 09-10-1933 DOA: 10/12/2014  Referring physician: Emergency Department PCP: Isabella StallingNDIEGO,RICHARD M, MD  Specialists:   Chief Complaint: Cellulitis  HPI: Angela DitchMaggie C Kliethermes is a 80 y.o. female  With a hx of DM, HTN, CAD who presents to the ED with worsening RLE cellulitis over the shin. Pt denies trauma or scratching. No fevers at home. Reports taking topical antifungals without significant improvement. In the ED, pt found to have wbc of 14k with elevated lactate of over 2. The patient was started on vanc and zosyn and hospitalist consulted for consideration for admission.  Review of Systems:  Per above, the remainder of the 10pt ros reviewed and are neg  Past Medical History  Diagnosis Date  . GERD (gastroesophageal reflux disease)   . Family hx of colon cancer   . Sphincter of Oddi spasm     Status post ERCP with sphincterotomy for SOD versus papillary stenosis  . Diverticulosis of colon   . Helicobacter pylori gastritis     s/p treatment?  . Gastroparesis 2005    Abnormal GS  . Heart disease, unspecified   . Osteoarthritis   . IBS (irritable bowel syndrome)   . Osteopenia   . Goiter     Dr Emmit Pomfretarol Wolicki  . Hypothyroid   . COPD (chronic obstructive pulmonary disease)   . Asthma   . Hypercholesterolemia   . HTN (hypertension)   . DM (diabetes mellitus)   . Fatty liver     Insetting of normal LFTs  . History of adenomatous polyp of colon   . Renal artery stenosis 07/06/12    R renal artery 60-99% diameter reduction PSV 336, EBV 107; >60% narrowing in celiac SMA and IMA  . Pulmonary nodules     Multiple, had previously been followed by CT, stable  . Chest pain     echo 01/29/11 - mild dilation of LA, mild diastolic dysfunction; cath 05/2007 - non-obstructive CAD 10% in LAD , 20-30% in RCA  . Dyspnea 07/30/12    stress test - normal perfusion all regions, EF 67%, no sig change from last study 2012  .  Lower extremity edema 01/07/11    doppler -no evidence of thrombus or insufficiency   Past Surgical History  Procedure Laterality Date  . Tubal ligation    . Partial hysterectomy    . Cholecystectomy    . Abdominal hernia repair      x 3  . Hip surgery      left  . Ercp w/ sphincterotomy and balloon dilation    . Colonoscopy  05/29/2008    Dr. Jena Gaussourk-> diverticulosis, hemorrhoids, 2 hyperplastic polyps  . Cardiac catheterization  05/28/07    normal LV fcn, 30% narrowing in osteum of RCA, 40% narrowig in ostium of LRA   Social History:  reports that she has never smoked. She has never used smokeless tobacco. She reports that she does not drink alcohol or use illicit drugs.  where does patient live--home, ALF, SNF? and with whom if at home?  Can patient participate in ADLs?  Allergies  Allergen Reactions  . Statins Other (See Comments)    myalgias  . Esomeprazole Magnesium     REACTION: rash  . Morphine     REACTION: UNKNOWN REACTION    Family History  Problem Relation Age of Onset  . Colon cancer Brother [id number]  . Lung cancer Brother   . Cancer Sister     bladder  .  Lung cancer Father     (be sure to complete)  Prior to Admission medications   Medication Sig Start Date End Date Taking? Authorizing Provider  albuterol (PROVENTIL HFA;VENTOLIN HFA) 108 (90 BASE) MCG/ACT inhaler Inhale 2 puffs into the lungs every 6 (six) hours as needed.      Historical Provider, MD  aspirin 81 MG tablet Take 81 mg by mouth daily.    Historical Provider, MD  cholecalciferol (VITAMIN D) 1000 UNITS tablet Take 2,000 Units by mouth daily.    Historical Provider, MD  furosemide (LASIX) 40 MG tablet Take 1 tablet (40 mg total) by mouth 2 (two) times daily. 09/06/13   Antoine PocheJonathan F Branch, MD  glipiZIDE (GLUCOTROL) 5 MG tablet Take 5 mg by mouth 2 (two) times daily before a meal.    Historical Provider, MD  ibuprofen (ADVIL,MOTRIN) 800 MG tablet Take 800 mg by mouth every 8 (eight) hours as needed. Rare     Historical Provider, MD  ipratropium-albuterol (DUONEB) 0.5-2.5 (3) MG/3ML SOLN Take 3 mLs by nebulization every 6 (six) hours as needed.    Historical Provider, MD  isosorbide mononitrate (IMDUR) 30 MG 24 hr tablet TAKE ONE TABLET TWICE DAILY 05/21/13   Lennette Biharihomas A Kelly, MD  lansoprazole (PREVACID) 30 MG capsule Take 1 capsule (30 mg total) by mouth daily. 10/16/11 01/09/14  Joselyn ArrowKandice L Jones, NP  levothyroxine (SYNTHROID, LEVOTHROID) 75 MCG tablet Take 75 mcg by mouth daily.      Historical Provider, MD  lisinopril (PRINIVIL,ZESTRIL) 40 MG tablet Take 1 tablet (40 mg total) by mouth daily. 03/17/14   Antoine PocheJonathan F Branch, MD  metoprolol succinate (TOPROL-XL) 100 MG 24 hr tablet TAKE ONE MG IN THE AM AND 5OMG IN THE PM 01/09/14   Jodelle GrossKathryn M Lawrence, NP  montelukast (SINGULAIR) 10 MG tablet Take 10 mg by mouth at bedtime.      Historical Provider, MD  NITROSTAT 0.4 MG SL tablet as needed. 10/03/11   Historical Provider, MD  nystatin-triamcinolone (MYCOLOG II) cream Apply 1 application topically 2 (two) times daily as needed. 10/16/11   Joselyn ArrowKandice L Jones, NP  tolterodine (DETROL LA) 4 MG 24 hr capsule Take 4 mg by mouth daily.      Historical Provider, MD  traMADol (ULTRAM) 50 MG tablet Take 50 mg by mouth every 6 (six) hours as needed. Maximum dose= 8 tablets per day     Historical Provider, MD   Physical Exam: Filed Vitals:   10/12/14 0409 10/12/14 0717 10/12/14 0730 10/12/14 0809  BP:  109/38 128/53 93/54  Pulse:  79 76 78  Temp: 98.5 F (36.9 C)     TempSrc: Oral     Resp:  18  16  Height:      Weight:      SpO2:  100% 96% 96%     General:  Awake, in nad  Eyes: PERRL B  ENT: membranes moist, dentition fair  Neck: trachea midline, neck supple  Cardiovascular: regular, s1, s2  Respiratory: normal resp effort, no wheezing  Abdomen: soft,nondistended  Skin: patch of tender erythema over R shin  Musculoskeletal: perfused, no clubbing  Psychiatric: mood/affect normal//no auditory/visual  hallucinations  Neurologic: cn2-12 grossly intact, strength/sensation intact  Labs on Admission:  Basic Metabolic Panel:  Recent Labs Lab 10/12/14 0400  NA 137  K 3.7  CL 97  CO2 22  GLUCOSE 281*  BUN 24*  CREATININE 0.70  CALCIUM 9.2   Liver Function Tests: No results for input(s): AST, ALT, ALKPHOS, BILITOT,  PROT, ALBUMIN in the last 168 hours. No results for input(s): LIPASE, AMYLASE in the last 168 hours. No results for input(s): AMMONIA in the last 168 hours. CBC:  Recent Labs Lab 10/12/14 0400  WBC 14.0*  NEUTROABS 12.3*  HGB 14.4  HCT 40.2  MCV 84.3  PLT 165   Cardiac Enzymes: No results for input(s): CKTOTAL, CKMB, CKMBINDEX, TROPONINI in the last 168 hours.  BNP (last 3 results) No results for input(s): PROBNP in the last 8760 hours. CBG: No results for input(s): GLUCAP in the last 168 hours.  Radiological Exams on Admission: Dg Tibia/fibula Right  10/12/2014   CLINICAL DATA:  Right leg redness and swelling.  EXAM: RIGHT TIBIA AND FIBULA - 2 VIEW  COMPARISON:  None.  FINDINGS: Diffuse nonspecific subcutaneous reticulation of the leg. This could represent edema or cellulitis. There is no soft tissue gas or radiopaque foreign body. No acute osseous findings. Knee osteoarthritis with moderate to advanced joint narrowing. Osteopenia, which accounts for lucent changes in the fibular more than tibial cortex.  IMPRESSION: Nonspecific leg edema. No subcutaneous gas or radiopaque foreign body.   Electronically Signed   By: Tiburcio Pea M.D.   On: 10/12/2014 04:47    Assessment/Plan Active Problems:   Hypothyroidism   Essential hypertension   Heart disease   Cellulitis and abscess of leg   1. Cellulitis 1. Afebrile 2. Pt with leukocytosis of 14k, lactate of over 2 3. Will cont vanc and zosyn started in ED 4. Follow blood cultures 5. Admit to med tele 2. DM 1. Random glucose over 200's 2. Will cont on SSI coverage 3. Will check a1c 4. On glipizide  prior to admit 3. CAD 1. Stable 4. HTN 1. Stable and controled 2. Cont home meds 5. Hypothyroid 1. Cont replacement per home meds 6. COPD  1. Stable 2. On min o2 support 7. DVT prophylaxis 1. Heparin subQ   Code Status: full (must indicate code status--if unknown or must be presumed, indicate so) Family Communication: Pt in room (indicate person spoken with, if applicable, with phone number if by telephone) Disposition Plan: Pending (indicate anticipated LOS)  Time spent:  CHIU, STEPHEN K [facility] Pager [phone]  If 7PM-7AM, please contact night-coverage [email or web address] Password [facility] 10/12/2014, 8:28 AM

## 2014-10-12 NOTE — Evaluation (Signed)
EDP: Zavitz Reason for call: Cellulitis  Received call about pt w/ RLE cellulitis x several days. Progressive redness. Baseline diabetic. hemodynamicallly stable, afebrile. Lactate 2.8. WBC 14. R tib/fib w/ nonspecific edema-no subq gas. Was given IV clinda x1. Asked EDP to switched to vanc and zosyn given diabetic status. Feels she is stable for tele bed.

## 2014-10-12 NOTE — ED Provider Notes (Signed)
CSN: [id number]     Arrival date & time 10/12/14  0245 History   First MD Initiated Contact with Patient 10/12/14 0256     Chief Complaint  Patient presents with  . Leg Swelling     (Consider location/radiation/quality/duration/timing/severity/associated sxs/prior Treatment) HPI Comments: 80 year old female with history of high cholesterol, high blood pressure, reflux, diabetes presents with right leg rash and tenderness. Gradually worsening the past 2 days. No history of cellulitis. No injuries. No fevers chills or vomiting. Patient feels generally weaker and fatigued than normal. No current antibiotics.  The history is provided by the patient and a relative.    Past Medical History  Diagnosis Date  . GERD (gastroesophageal reflux disease)   . Family hx of colon cancer   . Sphincter of Oddi spasm     Status post ERCP with sphincterotomy for SOD versus papillary stenosis  . Diverticulosis of colon   . Helicobacter pylori gastritis     s/p treatment?  . Gastroparesis 2005    Abnormal GS  . Heart disease, unspecified   . Osteoarthritis   . IBS (irritable bowel syndrome)   . Osteopenia   . Goiter     Dr Emmit Pomfretarol Wolicki  . Hypothyroid   . COPD (chronic obstructive pulmonary disease)   . Asthma   . Hypercholesterolemia   . HTN (hypertension)   . DM (diabetes mellitus)   . Fatty liver     Insetting of normal LFTs  . History of adenomatous polyp of colon   . Renal artery stenosis 07/06/12    R renal artery 60-99% diameter reduction PSV 336, EBV 107; >60% narrowing in celiac SMA and IMA  . Pulmonary nodules     Multiple, had previously been followed by CT, stable  . Chest pain     echo 01/29/11 - mild dilation of LA, mild diastolic dysfunction; cath 05/2007 - non-obstructive CAD 10% in LAD , 20-30% in RCA  . Dyspnea 07/30/12    stress test - normal perfusion all regions, EF 67%, no sig change from last study 2012  . Lower extremity edema 01/07/11    doppler -no evidence of  thrombus or insufficiency   Past Surgical History  Procedure Laterality Date  . Tubal ligation    . Partial hysterectomy    . Cholecystectomy    . Abdominal hernia repair      x 3  . Hip surgery      left  . Ercp w/ sphincterotomy and balloon dilation    . Colonoscopy  05/29/2008    Dr. Jena Gaussourk-> diverticulosis, hemorrhoids, 2 hyperplastic polyps  . Cardiac catheterization  05/28/07    normal LV fcn, 30% narrowing in osteum of RCA, 40% narrowig in ostium of LRA   Family History  Problem Relation Age of Onset  . Colon cancer Brother 1870  . Lung cancer Brother   . Cancer Sister     bladder  . Lung cancer Father    History  Substance Use Topics  . Smoking status: Never Smoker   . Smokeless tobacco: Never Used  . Alcohol Use: No   OB History    No data available     Review of Systems  Constitutional: Positive for fatigue. Negative for fever and chills.  HENT: Negative for congestion.   Eyes: Negative for visual disturbance.  Respiratory: Negative for shortness of breath.   Cardiovascular: Positive for leg swelling (chronic bilateral lower extremities). Negative for chest pain.  Gastrointestinal: Negative for vomiting and abdominal pain.  Genitourinary: Negative for dysuria and flank pain.  Musculoskeletal: Negative for back pain, neck pain and neck stiffness.  Skin: Positive for rash.  Neurological: Positive for weakness (general weak). Negative for light-headedness and headaches.      Allergies  Statins; Esomeprazole magnesium; and Morphine  Home Medications   Prior to Admission medications   Medication Sig Start Date End Date Taking? Authorizing Provider  albuterol (PROVENTIL HFA;VENTOLIN HFA) 108 (90 BASE) MCG/ACT inhaler Inhale 2 puffs into the lungs every 6 (six) hours as needed.      Historical Provider, MD  aspirin 81 MG tablet Take 81 mg by mouth daily.    Historical Provider, MD  cholecalciferol (VITAMIN D) 1000 UNITS tablet Take 2,000 Units by mouth daily.     Historical Provider, MD  furosemide (LASIX) 40 MG tablet Take 1 tablet (40 mg total) by mouth 2 (two) times daily. 09/06/13   Antoine PocheJonathan F Branch, MD  glipiZIDE (GLUCOTROL) 5 MG tablet Take 5 mg by mouth 2 (two) times daily before a meal.    Historical Provider, MD  ibuprofen (ADVIL,MOTRIN) 800 MG tablet Take 800 mg by mouth every 8 (eight) hours as needed. Rare    Historical Provider, MD  ipratropium-albuterol (DUONEB) 0.5-2.5 (3) MG/3ML SOLN Take 3 mLs by nebulization every 6 (six) hours as needed.    Historical Provider, MD  isosorbide mononitrate (IMDUR) 30 MG 24 hr tablet TAKE ONE TABLET TWICE DAILY 05/21/13   Lennette Biharihomas A Kelly, MD  lansoprazole (PREVACID) 30 MG capsule Take 1 capsule (30 mg total) by mouth daily. 10/16/11 01/09/14  Joselyn ArrowKandice L Jones, NP  levothyroxine (SYNTHROID, LEVOTHROID) 75 MCG tablet Take 75 mcg by mouth daily.      Historical Provider, MD  lisinopril (PRINIVIL,ZESTRIL) 40 MG tablet Take 1 tablet (40 mg total) by mouth daily. 03/17/14   Antoine PocheJonathan F Branch, MD  metoprolol succinate (TOPROL-XL) 100 MG 24 hr tablet TAKE ONE MG IN THE AM AND 5OMG IN THE PM 01/09/14   Jodelle GrossKathryn M Lawrence, NP  montelukast (SINGULAIR) 10 MG tablet Take 10 mg by mouth at bedtime.      Historical Provider, MD  NITROSTAT 0.4 MG SL tablet as needed. 10/03/11   Historical Provider, MD  nystatin-triamcinolone (MYCOLOG II) cream Apply 1 application topically 2 (two) times daily as needed. 10/16/11   Joselyn ArrowKandice L Jones, NP  tolterodine (DETROL LA) 4 MG 24 hr capsule Take 4 mg by mouth daily.      Historical Provider, MD  traMADol (ULTRAM) 50 MG tablet Take 50 mg by mouth every 6 (six) hours as needed. Maximum dose= 8 tablets per day     Historical Provider, MD   BP 143/68 mmHg  Pulse 82  Temp(Src) 98.5 F (36.9 C) (Oral)  Resp 20  Ht 5\' 1"  (1.549 m)  Wt 190 lb (86.183 kg)  BMI 35.92 kg/m2  SpO2 97% Physical Exam  Constitutional: She is oriented to person, place, and time. She appears well-developed and  well-nourished.  HENT:  Head: Normocephalic and atraumatic.  Eyes: Right eye exhibits no discharge. Left eye exhibits no discharge.  Neck: Normal range of motion. Neck supple. No tracheal deviation present.  Cardiovascular: Normal rate.   Pulmonary/Chest: Effort normal.  Abdominal: Soft.  Musculoskeletal: She exhibits edema and tenderness.  Neurological: She is alert and oriented to person, place, and time.  Skin: Skin is warm. No rash noted.  Patient has 2+ edema bilateral lower extremities. Patient has significant warmth and erythema tenderness to palpation diffuse right lower leg  from knee down to midfoot, no crepitus or abscess appreciated.  Psychiatric: She has a normal mood and affect.  Nursing note and vitals reviewed.   ED Course  Procedures (including critical care time) Labs Review Labs Reviewed  BASIC METABOLIC PANEL - Abnormal; Notable for the following:    Glucose, Bld 281 (*)    BUN 24 (*)    GFR calc non Af Amer 80 (*)    Anion gap 18 (*)    All other components within normal limits  CBC WITH DIFFERENTIAL - Abnormal; Notable for the following:    WBC 14.0 (*)    Neutrophils Relative % 88 (*)    Neutro Abs 12.3 (*)    Lymphocytes Relative 7 (*)    All other components within normal limits  I-STAT CG4 LACTIC ACID, ED - Abnormal; Notable for the following:    Lactic Acid, Venous 2.87 (*)    All other components within normal limits  CULTURE, BLOOD (ROUTINE X 2)  CULTURE, BLOOD (ROUTINE X 2)    Imaging Review Dg Tibia/fibula Right  10/12/2014   CLINICAL DATA:  Right leg redness and swelling.  EXAM: RIGHT TIBIA AND FIBULA - 2 VIEW  COMPARISON:  None.  FINDINGS: Diffuse nonspecific subcutaneous reticulation of the leg. This could represent edema or cellulitis. There is no soft tissue gas or radiopaque foreign body. No acute osseous findings. Knee osteoarthritis with moderate to advanced joint narrowing. Osteopenia, which accounts for lucent changes in the fibular  more than tibial cortex.  IMPRESSION: Nonspecific leg edema. No subcutaneous gas or radiopaque foreign body.   Electronically Signed   By: Tiburcio Pea M.D.   On: 10/12/2014 04:47     EKG Interpretation None      MDM   Final diagnoses:  Right leg pain  Cellulitis of right leg  Sepsis, due to unspecified organism  General weakness  Hyperglycemia   Patient with clinical cellulitis of the right leg in a diabetic patient with general weakness. Plan for blood work, IV fluid bolus, x-ray, cultures, IV antibiotics.  Patient with hyperglycemia, leukocytosis. With general weakness, age and diabetes IV antibiotics ordered. Discussed the case with [facility] hospitalist who recommended Vancomycin and Zosyn.  Patient admitted to telemetry.  The patients results and plan were reviewed and discussed.   Any x-rays performed were personally reviewed by myself.   Differential diagnosis were considered with the presenting HPI.  Medications  ondansetron (ZOFRAN) injection 4 mg (not administered)  vancomycin (VANCOCIN) IVPB 1000 mg/200 mL premix (not administered)  piperacillin-tazobactam (ZOSYN) IVPB 3.375 g (not administered)  0.9 %  sodium chloride infusion (not administered)  clindamycin (CLEOCIN) IVPB 600 mg (0 mg Intravenous Stopped 10/12/14 0452)  sodium chloride 0.9 % bolus 500 mL (500 mLs Intravenous New Bag/Given 10/12/14 0410)  HYDROmorphone (DILAUDID) injection 0.5 mg (0.5 mg Intravenous Given 10/12/14 0500)    Filed Vitals:   10/12/14 0303 10/12/14 0409  BP: 143/68   Pulse: 82   Temp:  98.5 F (36.9 C)  TempSrc:  Oral  Resp: 20   Height: 5\' 1"  (1.549 m)   Weight: 190 lb (86.183 kg)   SpO2: 97%     Final diagnoses:  Right leg pain  Cellulitis of right leg  Sepsis, due to unspecified organism  General weakness  Hyperglycemia    Admission/ observation were discussed with the admitting physician, patient and/or family and they are comfortable with the plan.       Enid Skeens, MD 10/12/14 [phone]

## 2014-10-12 NOTE — ED Notes (Signed)
Zosyn sent up with pt. Received RN aware. Hospitalist currently at bedside.

## 2014-10-13 ENCOUNTER — Inpatient Hospital Stay (HOSPITAL_COMMUNITY): Payer: Medicare Other

## 2014-10-13 DIAGNOSIS — L03115 Cellulitis of right lower limb: Secondary | ICD-10-CM | POA: Diagnosis not present

## 2014-10-13 LAB — COMPREHENSIVE METABOLIC PANEL
ALBUMIN: 3.2 g/dL — AB (ref 3.5–5.2)
ALK PHOS: 49 U/L (ref 39–117)
ALT: 11 U/L (ref 0–35)
AST: 10 U/L (ref 0–37)
Anion gap: 10 (ref 5–15)
BUN: 21 mg/dL (ref 6–23)
CO2: 29 mEq/L (ref 19–32)
Calcium: 8.8 mg/dL (ref 8.4–10.5)
Chloride: 100 mEq/L (ref 96–112)
Creatinine, Ser: 1.08 mg/dL (ref 0.50–1.10)
GFR calc Af Amer: 55 mL/min — ABNORMAL LOW (ref >90)
GFR calc non Af Amer: 48 mL/min — ABNORMAL LOW (ref >90)
Glucose, Bld: 209 mg/dL — ABNORMAL HIGH (ref 70–99)
POTASSIUM: 3.4 meq/L — AB (ref 3.7–5.3)
Sodium: 139 mEq/L (ref 137–147)
TOTAL PROTEIN: 6.1 g/dL (ref 6.0–8.3)
Total Bilirubin: 0.4 mg/dL (ref 0.3–1.2)

## 2014-10-13 LAB — GLUCOSE, CAPILLARY
GLUCOSE-CAPILLARY: 332 mg/dL — AB (ref 70–99)
Glucose-Capillary: 247 mg/dL — ABNORMAL HIGH (ref 70–99)
Glucose-Capillary: 215 mg/dL — ABNORMAL HIGH (ref 70–99)
Glucose-Capillary: 171 mg/dL — ABNORMAL HIGH (ref 70–99)

## 2014-10-13 LAB — CBC
HEMATOCRIT: 35.6 % — AB (ref 36.0–46.0)
Hemoglobin: 12.3 g/dL (ref 12.0–15.0)
MCH: 29.6 pg (ref 26.0–34.0)
MCHC: 34.6 g/dL (ref 30.0–36.0)
MCV: 85.6 fL (ref 78.0–100.0)
Platelets: 148 10*3/uL — ABNORMAL LOW (ref 150–400)
RBC: 4.16 MIL/uL (ref 3.87–5.11)
RDW: 13.2 % (ref 11.5–15.5)
WBC: 6.7 10*3/uL (ref 4.0–10.5)

## 2014-10-13 LAB — LIPID PANEL
CHOL/HDL RATIO: 3.7 ratio
Cholesterol: 99 mg/dL (ref 0–200)
HDL: 27 mg/dL — ABNORMAL LOW (ref >39)
LDL CALC: 44 mg/dL (ref 0–99)
TRIGLYCERIDES: 139 mg/dL (ref <150)
VLDL: 28 mg/dL (ref 0–40)

## 2014-10-13 MED ORDER — INSULIN ASPART 100 UNIT/ML ~~LOC~~ SOLN
0.0000 [IU] | Freq: Three times a day (TID) | SUBCUTANEOUS | Status: DC
Start: 2014-10-13 — End: 2014-10-16
  Administered 2014-10-13: 3 [IU] via SUBCUTANEOUS
  Administered 2014-10-14: 2 [IU] via SUBCUTANEOUS
  Administered 2014-10-14: 1 [IU] via SUBCUTANEOUS
  Administered 2014-10-14: 2 [IU] via SUBCUTANEOUS
  Administered 2014-10-15: 5 [IU] via SUBCUTANEOUS
  Administered 2014-10-15: 2 [IU] via SUBCUTANEOUS
  Administered 2014-10-15 – 2014-10-16 (×2): 3 [IU] via SUBCUTANEOUS
  Administered 2014-10-16: 2 [IU] via SUBCUTANEOUS

## 2014-10-13 MED ORDER — GLIPIZIDE 5 MG PO TABS
10.0000 mg | ORAL_TABLET | Freq: Two times a day (BID) | ORAL | Status: DC
  Administered 2014-10-13 – 2014-10-16 (×6): 10 mg via ORAL
  Filled 2014-10-13 (×7): qty 2

## 2014-10-13 MED ORDER — PIPERACILLIN-TAZOBACTAM 3.375 G IVPB
3.3750 g | Freq: Three times a day (TID) | INTRAVENOUS | Status: DC
  Administered 2014-10-13 – 2014-10-16 (×8): 3.375 g via INTRAVENOUS
  Filled 2014-10-13 (×12): qty 50

## 2014-10-13 MED ORDER — GLIPIZIDE 5 MG PO TABS: 10.0000 mg | ORAL_TABLET | Freq: Every day | ORAL | Status: DC

## 2014-10-13 MED ORDER — HYDROCERIN EX CREA
TOPICAL_CREAM | Freq: Two times a day (BID) | CUTANEOUS | Status: DC
  Administered 2014-10-13 (×2): via TOPICAL
  Administered 2014-10-14: 1 via TOPICAL
  Administered 2014-10-14 – 2014-10-16 (×4): via TOPICAL
  Filled 2014-10-13: qty 113

## 2014-10-13 NOTE — Progress Notes (Signed)
Dr. Janna Archondiego notified me that after talking to the patient she voiced some concerns about getting up out of bed but could not because of the bed alarm going off.  He stated that the patient could be talking off the alarm so that she can get up.  I discussed this with the patient voicing that she could get up, but I will leave the bed alarm, yellow socks/band, and signage up due to her multiple falls at home.  This was at least staff would know she was getting up and could come in and assist her .  She verbalized understanding.

## 2014-10-13 NOTE — Consult Note (Signed)
Reason for Consult: Right lower extremity cellulitis Referring Physician: Dr. Lorriane Shire  Angela [name] is an 80 y.o. female.  HPI: Patient is a 80 year old white female multiple medical problems including diabetes mellitus and obesity who presents with significant erythema and swelling of the right lower extremity. She has had a workup in the past which did not show significant venous insufficiency, though this was 3 years ago. She states that she has had some irritation of the skin and was putting a cream on it recently. She came to the hospital due to worsening redness and swelling in the right lower extremity. No arterial segmental Dopplers have been performed as far as I can see. Venous Doppler study done the time of admission is negative for DVT.  Past Medical History  Diagnosis Date  . GERD (gastroesophageal reflux disease)   . Family hx of colon cancer   . Sphincter of Oddi spasm     Status post ERCP with sphincterotomy for SOD versus papillary stenosis  . Diverticulosis of colon   . Helicobacter pylori gastritis     s/p treatment?  . Gastroparesis 2005    Abnormal GS  . Heart disease, unspecified   . Osteoarthritis   . IBS (irritable bowel syndrome)   . Osteopenia   . Goiter     Dr Carolynn Serve  . Hypothyroid   . COPD (chronic obstructive pulmonary disease)   . Asthma   . Hypercholesterolemia   . HTN (hypertension)   . DM (diabetes mellitus)   . Fatty liver     Insetting of normal LFTs  . History of adenomatous polyp of colon   . Renal artery stenosis 07/06/12    R renal artery 60-99% diameter reduction PSV 336, EBV 107; >60% narrowing in celiac SMA and IMA  . Pulmonary nodules     Multiple, had previously been followed by CT, stable  . Chest pain     echo 01/29/11 - mild dilation of LA, mild diastolic dysfunction; cath 05/2007 - non-obstructive CAD 10% in LAD , 20-30% in RCA  . Dyspnea 07/30/12    stress test - normal perfusion all regions, EF 67%, no sig change from  last study 2012  . Lower extremity edema 01/07/11    doppler -no evidence of thrombus or insufficiency    Past Surgical History  Procedure Laterality Date  . Tubal ligation    . Partial hysterectomy    . Cholecystectomy    . Abdominal hernia repair      x 3  . Hip surgery      left  . Ercp w/ sphincterotomy and balloon dilation    . Colonoscopy  05/29/2008    Dr. Gala Romney diverticulosis, hemorrhoids, 2 hyperplastic polyps  . Cardiac catheterization  05/28/07    normal LV fcn, 30% narrowing in osteum of RCA, 40% narrowig in ostium of LRA    Family History  Problem Relation Age of Onset  . Colon cancer Brother 34  . Lung cancer Brother   . Cancer Sister     bladder  . Lung cancer Father     Social History:  reports that she has never smoked. She has never used smokeless tobacco. She reports that she does not drink alcohol or use illicit drugs.  Allergies:  Allergies  Allergen Reactions  . Statins Other (See Comments)    myalgias  . Esomeprazole Magnesium     REACTION: rash  . Morphine     REACTION: UNKNOWN REACTION    Medications:  I have reviewed the patient's current medications.  Results for orders placed or performed during the hospital encounter of 10/12/14 (from the past 48 hour(s))  Hemoglobin A1c     Status: Abnormal   Collection Time: 10/12/14  3:35 AM  Result Value Ref Range   Hgb A1c MFr Bld 8.4 (H) <5.7 %    Comment: (NOTE)                                                                       According to the ADA Clinical Practice Recommendations for 2011, when HbA1c is used as a screening test:  >=6.5%   Diagnostic of Diabetes Mellitus           (if abnormal result is confirmed) 5.7-6.4%   Increased risk of developing Diabetes Mellitus References:Diagnosis and Classification of Diabetes Mellitus,Diabetes DVVO,1607,37(TGGYI 1):S62-S69 and Standards of Medical Care in         Diabetes - 2011,Diabetes Care,2011,34 (Suppl 1):S11-S61.    Mean Plasma  Glucose 194 (H) <117 mg/dL    Comment: Performed at [facility] metabolic panel     Status: Abnormal   Collection Time: 10/12/14  4:00 AM  Result Value Ref Range   Sodium 137 137 - 147 mEq/L   Potassium 3.7 3.7 - 5.3 mEq/L   Chloride 97 96 - 112 mEq/L   CO2 22 19 - 32 mEq/L   Glucose, Bld 281 (H) 70 - 99 mg/dL   BUN 24 (H) 6 - 23 mg/dL   Creatinine, Ser 0.70 0.50 - 1.10 mg/dL   Calcium 9.2 8.4 - 10.5 mg/dL   GFR calc non Af Amer 80 (L) >90 mL/min   GFR calc Af Amer >90 >90 mL/min    Comment: (NOTE) The eGFR has been calculated using the CKD EPI equation. This calculation has not been validated in all clinical situations. eGFR's persistently <90 mL/min signify possible Chronic Kidney Disease.    Anion gap 18 (H) 5 - 15  CBC with Differential     Status: Abnormal   Collection Time: 10/12/14  4:00 AM  Result Value Ref Range   WBC 14.0 (H) 4.0 - 10.5 K/uL    Comment: RESULT REPEATED AND VERIFIED   RBC 4.77 3.87 - 5.11 MIL/uL   Hemoglobin 14.4 12.0 - 15.0 g/dL   HCT 40.2 36.0 - 46.0 %   MCV 84.3 78.0 - 100.0 fL   MCH 30.2 26.0 - 34.0 pg   MCHC 35.8 30.0 - 36.0 g/dL   RDW 12.8 11.5 - 15.5 %   Platelets 165 150 - 400 K/uL    Comment: RESULT REPEATED AND VERIFIED SPECIMEN CHECKED FOR CLOTS    Neutrophils Relative % 88 (H) 43 - 77 %   Neutro Abs 12.3 (H) 1.7 - 7.7 K/uL   Lymphocytes Relative 7 (L) 12 - 46 %   Lymphs Abs 0.9 0.7 - 4.0 K/uL   Monocytes Relative 5 3 - 12 %   Monocytes Absolute 0.7 0.1 - 1.0 K/uL   Eosinophils Relative 0 0 - 5 %   Eosinophils Absolute 0.0 0.0 - 0.7 K/uL   Basophils Relative 0 0 - 1 %   Basophils Absolute 0.0 0.0 - 0.1 K/uL   Smear Review PLATELETS  APPEAR ADEQUATE     Comment: SMEAR STAINED AND AVAILABLE FOR REVIEW  Blood culture (routine x 2)     Status: None (Preliminary result)   Collection Time: 10/12/14  4:00 AM  Result Value Ref Range   Specimen Description BLOOD RIGHT HAND    Special Requests      BOTTLES DRAWN AEROBIC  AND ANAEROBIC AEB 8CC ANA [place]   Culture NO GROWTH <24 HRS    Report Status PENDING   Blood culture (routine x 2)     Status: None (Preliminary result)   Collection Time: 10/12/14  4:05 AM  Result Value Ref Range   Specimen Description BLOOD LEFT ANTECUBITAL    Special Requests BOTTLES DRAWN AEROBIC AND ANAEROBIC 10CC EACH    Culture NO GROWTH <24 HRS    Report Status PENDING   I-Stat CG4 Lactic Acid, ED     Status: Abnormal   Collection Time: 10/12/14  4:09 AM  Result Value Ref Range   Lactic Acid, Venous 2.87 (H) 0.5 - 2.2 mmol/L  Glucose, capillary     Status: Abnormal   Collection Time: 10/12/14  9:51 AM  Result Value Ref Range   Glucose-Capillary 181 (H) 70 - 99 mg/dL  Glucose, capillary     Status: Abnormal   Collection Time: 10/12/14 11:55 AM  Result Value Ref Range   Glucose-Capillary 258 (H) 70 - 99 mg/dL  Glucose, capillary     Status: Abnormal   Collection Time: 10/12/14  4:36 PM  Result Value Ref Range   Glucose-Capillary 259 (H) 70 - 99 mg/dL   Comment 1 Documented in Chart    Comment 2 Notify RN   Glucose, capillary     Status: Abnormal   Collection Time: 10/12/14  9:40 PM  Result Value Ref Range   Glucose-Capillary 268 (H) 70 - 99 mg/dL   Comment 1 Documented in Chart    Comment 2 Notify RN   Comprehensive metabolic panel     Status: Abnormal   Collection Time: 10/13/14  6:02 AM  Result Value Ref Range   Sodium 139 137 - 147 mEq/L   Potassium 3.4 (L) 3.7 - 5.3 mEq/L   Chloride 100 96 - 112 mEq/L   CO2 29 19 - 32 mEq/L   Glucose, Bld 209 (H) 70 - 99 mg/dL   BUN 21 6 - 23 mg/dL   Creatinine, Ser 1.08 0.50 - 1.10 mg/dL    Comment: DELTA CHECK NOTED   Calcium 8.8 8.4 - 10.5 mg/dL   Total Protein 6.1 6.0 - 8.3 g/dL   Albumin 3.2 (L) 3.5 - 5.2 g/dL   AST 10 0 - 37 U/L   ALT 11 0 - 35 U/L   Alkaline Phosphatase 49 39 - 117 U/L   Total Bilirubin 0.4 0.3 - 1.2 mg/dL   GFR calc non Af Amer 48 (L) >90 mL/min   GFR calc Af Amer 55 (L) >90 mL/min    Comment:  (NOTE) The eGFR has been calculated using the CKD EPI equation. This calculation has not been validated in all clinical situations. eGFR's persistently <90 mL/min signify possible Chronic Kidney Disease.    Anion gap 10 5 - 15  CBC     Status: Abnormal   Collection Time: 10/13/14  6:02 AM  Result Value Ref Range   WBC 6.7 4.0 - 10.5 K/uL   RBC 4.16 3.87 - 5.11 MIL/uL   Hemoglobin 12.3 12.0 - 15.0 g/dL   HCT 35.6 (L) 36.0 - 46.0 %  MCV 85.6 78.0 - 100.0 fL   MCH 29.6 26.0 - 34.0 pg   MCHC 34.6 30.0 - 36.0 g/dL   RDW 13.2 11.5 - 15.5 %   Platelets 148 (L) 150 - 400 K/uL  Lipid panel     Status: Abnormal   Collection Time: 10/13/14  6:02 AM  Result Value Ref Range   Cholesterol 99 0 - 200 mg/dL   Triglycerides 139 <150 mg/dL   HDL 27 (L) >39 mg/dL   Total CHOL/HDL Ratio 3.7 RATIO   VLDL 28 0 - 40 mg/dL   LDL Cholesterol 44 0 - 99 mg/dL    Comment:        Total Cholesterol/HDL:CHD Risk Coronary Heart Disease Risk Table                     Men   Women  1/2 Average Risk   3.4   3.3  Average Risk       5.0   4.4  2 X Average Risk   9.6   7.1  3 X Average Risk  23.4   11.0        Use the calculated Patient Ratio above and the CHD Risk Table to determine the patient's CHD Risk.        ATP III CLASSIFICATION (LDL):  <100     mg/dL   Optimal  100-129  mg/dL   Near or Above                    Optimal  130-159  mg/dL   Borderline  160-189  mg/dL   High  >190     mg/dL   Very High   Glucose, capillary     Status: Abnormal   Collection Time: 10/13/14  7:40 AM  Result Value Ref Range   Glucose-Capillary 247 (H) 70 - 99 mg/dL   Comment 1 Notify RN     Dg Tibia/fibula Right  10/12/2014   CLINICAL DATA:  Right leg redness and swelling.  EXAM: RIGHT TIBIA AND FIBULA - 2 VIEW  COMPARISON:  None.  FINDINGS: Diffuse nonspecific subcutaneous reticulation of the leg. This could represent edema or cellulitis. There is no soft tissue gas or radiopaque foreign body. No acute osseous  findings. Knee osteoarthritis with moderate to advanced joint narrowing. Osteopenia, which accounts for lucent changes in the fibular more than tibial cortex.  IMPRESSION: Nonspecific leg edema. No subcutaneous gas or radiopaque foreign body.   Electronically Signed   By: Jorje Guild M.D.   On: 10/12/2014 04:47    ROS: See chart Blood pressure 121/69, pulse 84, temperature 98.5 F (36.9 C), temperature source Oral, resp. rate 20, height 5' 1"  (1.549 m), weight 86.183 kg (190 lb), SpO2 98 %. Physical Exam: Pleasant white female in no acute distress. Right lower extremity reveals a palpable femoral pulse. I think I feel a palpable dorsalis pedis pulse, though there is soft tissue swelling from the pretibial region down to the ankle and proximal foot. Erythema and edema are noted in the pretibial region circumferentially down to the ankle. Her calf muscle appears soft. Significant erythema is noted, though no specific induration or abscess cavity is found. She is able to wiggle her toes and move her foot.  Assessment/Plan: Impression: Cellulitis of right lower extremity. These findings seem more consistent with stasis dermatitis, though she has had venous studies in the past which have been negative for venous insufficiency. This could be arterial in nature,  and a follow-up arterial Doppler study would be needed once this resolves. She has no leukocytosis. Would continue IV antibiotics and skin care. Would also elevate right lower extremity while she is lying in bed. No need for acute surgical intervention this time. We'll follow with you.  Tylik Treese A 10/13/2014, 10:14 AM

## 2014-10-13 NOTE — Care Management Note (Addendum)
    Page 1 of 1   10/16/2014     1:48:21 PM CARE MANAGEMENT NOTE 10/16/2014  Patient:  Marius DitchSCHAL,Annistyn C   Account Number:  [id number]  Date Initiated:  10/13/2014  Documentation initiated by:  Kathyrn SheriffHILDRESS,JESSICA  Subjective/Objective Assessment:   Pt lives alone with lots of support from children. Pt is independent with ADL's, has a cane and walker that she uses PRN and [facility]. Pt has no HH services or medication needs prior to admission.     Action/Plan:   Plan to discharge home with self care. No CM needs identified at this time.   Anticipated DC Date:  10/16/2014   Anticipated DC Plan:  HOME/SELF CARE      DC Planning Services  CM consult      Choice offered to / List presented to:             Status of service:  Completed, signed off Medicare Important Message given?  YES (If response is "NO", the following Medicare IM given date fields will be blank) Date Medicare IM given:  10/13/2014 Medicare IM given by:  Kathyrn SheriffHILDRESS,JESSICA Date Additional Medicare IM given:  10/16/2014 Additional Medicare IM given by:  JESSICA CHILDRESS  Discharge Disposition:  HOME/SELF CARE  Per UR Regulation:    If discussed at Long Length of Stay Meetings, dates discussed:    Comments:  10/16/2014 1330 Kathyrn SheriffJessica Childress, RN, MSN, PCCN Pt being discharged home with self care. No CM needs identified at this time.  10/13/2014 1400 Kathyrn SheriffJessica Childress, RN, MSN, [facility]

## 2014-10-13 NOTE — Progress Notes (Signed)
Notified Dr. Janna Archondiego to discuss the patient being hyperglycemic.  Voiced to him that there was no day time coverage ordered at this time.  I did see the order for QHS insulin ordered.  Recommendations: Glipizide per home orders and SSI insulin.  New orders given and followed.

## 2014-10-13 NOTE — Care Management Utilization Note (Signed)
UR review complete.  

## 2014-10-13 NOTE — Progress Notes (Signed)
Consideration given to underlying DVT will perform venous Dopplers today early on vancomycin and Zosyn since admission likewise diabetic hypertensive with minimal CAD Angela [name] ZOX:[id number] DOB: 1933-10-05 DOA: 10/12/2014 PCP: Isabella StallingNDIEGO,Cariann Kinnamon M, MD             Physical Exam: Blood pressure 122/47, pulse 73, temperature 98.5 F (36.9 C), temperature source Oral, resp. rate 20, height 5\' 1"  (1.549 m), weight 190 lb (86.183 kg), SpO2 98 %. no JVD no carotid bruits no thyromegaly lungs clear to A&P no rales rhonchi heart regular rhythm no S3-S4 measles rubs abdomen soft nontender bowel sounds normoactive right lower extremities: Tender and erythematous no palpable cord or Homans sign negative   Investigations:  Recent Results (from the past 240 hour(s))  Blood culture (routine x 2)     Status: None (Preliminary result)   Collection Time: 10/12/14  4:00 AM  Result Value Ref Range Status   Specimen Description BLOOD RIGHT HAND  Final   Special Requests   Final    BOTTLES DRAWN AEROBIC AND ANAEROBIC AEB 8CC ANA 6CC   Culture NO GROWTH <24 HRS  Final   Report Status PENDING  Incomplete  Blood culture (routine x 2)     Status: None (Preliminary result)   Collection Time: 10/12/14  4:05 AM  Result Value Ref Range Status   Specimen Description BLOOD LEFT ANTECUBITAL  Final   Special Requests BOTTLES DRAWN AEROBIC AND ANAEROBIC 10CC EACH  Final   Culture NO GROWTH <24 HRS  Final   Report Status PENDING  Incomplete     Basic Metabolic Panel:  Recent Labs  81/19/1411/26/15 0400 10/13/14 0602  NA 137 139  K 3.7 3.4*  CL 97 100  CO2 22 29  GLUCOSE 281* 209*  BUN 24* 21  CREATININE 0.70 1.08  CALCIUM 9.2 8.8   Liver Function Tests:  Recent Labs  10/13/14 0602  AST 10  ALT 11  ALKPHOS 49  BILITOT 0.4  PROT 6.1  ALBUMIN 3.2*     CBC:  Recent Labs  10/12/14 0400 10/13/14 0602  WBC 14.0* 6.7  NEUTROABS 12.3*  --   HGB 14.4 12.3  HCT 40.2 35.6*  MCV 84.3 85.6   PLT 165 148*    Dg Tibia/fibula Right  10/12/2014   CLINICAL DATA:  Right leg redness and swelling.  EXAM: RIGHT TIBIA AND FIBULA - 2 VIEW  COMPARISON:  None.  FINDINGS: Diffuse nonspecific subcutaneous reticulation of the leg. This could represent edema or cellulitis. There is no soft tissue gas or radiopaque foreign body. No acute osseous findings. Knee osteoarthritis with moderate to advanced joint narrowing. Osteopenia, which accounts for lucent changes in the fibular more than tibial cortex.  IMPRESSION: Nonspecific leg edema. No subcutaneous gas or radiopaque foreign body.   Electronically Signed   By: Tiburcio PeaJonathan  Watts M.D.   On: 10/12/2014 04:47      Medications:   Impression: Type 2 diabetes Active Problems:   Hypothyroidism   Essential hypertension   Heart disease   Cellulitis and abscess of leg     Plan: Continue vancomycin and Zosyn obtain  venous duplex scan of lower extremities. Surgical consultation  Consultants: Surgery   Procedures   Antibiotics: Vancomycin and Zosyn                  Code Status: Full  Family Communication:    Disposition Plan DD dual antibiotics venous Doppler Doppler Doppler of lower extremities to rule out DVT underlying  Time  spent: 30 minutes   LOS: 1 day   Charm Stenner M   10/13/2014, 8:30 AM

## 2014-10-14 LAB — BASIC METABOLIC PANEL
Anion gap: 12 (ref 5–15)
BUN: 15 mg/dL (ref 6–23)
CO2: 32 meq/L (ref 19–32)
CREATININE: 0.79 mg/dL (ref 0.50–1.10)
Calcium: 8.9 mg/dL (ref 8.4–10.5)
Chloride: 100 mEq/L (ref 96–112)
GFR calc Af Amer: 89 mL/min — ABNORMAL LOW (ref >90)
GFR calc non Af Amer: 77 mL/min — ABNORMAL LOW (ref >90)
GLUCOSE: 138 mg/dL — AB (ref 70–99)
Potassium: 3.1 mEq/L — ABNORMAL LOW (ref 3.7–5.3)
Sodium: 144 mEq/L (ref 137–147)

## 2014-10-14 LAB — GLUCOSE, CAPILLARY
GLUCOSE-CAPILLARY: 171 mg/dL — AB (ref 70–99)
GLUCOSE-CAPILLARY: 176 mg/dL — AB (ref 70–99)
Glucose-Capillary: 149 mg/dL — ABNORMAL HIGH (ref 70–99)
Glucose-Capillary: 209 mg/dL — ABNORMAL HIGH (ref 70–99)

## 2014-10-14 MED ORDER — POTASSIUM CHLORIDE CRYS ER 20 MEQ PO TBCR
20.0000 meq | EXTENDED_RELEASE_TABLET | Freq: Two times a day (BID) | ORAL | Status: DC
  Administered 2014-10-14 – 2014-10-16 (×5): 20 meq via ORAL
  Filled 2014-10-14 (×5): qty 1

## 2014-10-14 NOTE — Progress Notes (Signed)
Subjective: Right lower extremity with less circumferential erythema.  Objective: Vital signs in last 24 hours: Temp:  [98.8 F (37.1 C)-99.1 F (37.3 C)] 99.1 F (37.3 C) (11/28 0609) Pulse Rate:  [72-82] 72 (11/28 0609) Resp:  [20] 20 (11/28 0609) BP: (122-131)/(57-88) 129/57 mmHg (11/28 0609) SpO2:  [97 %-99 %] 97 % (11/28 0609) Last BM Date: 10/13/14  Intake/Output from previous day: 11/27 0701 - 11/28 0700 In: 1940 [P.O.:840; IV Piggyback:1100] Out: 2700 [Urine:2700] Intake/Output this shift:    General appearance: alert, cooperative and no distress Extremities: Decreased erythema noted in right lower extremity. Is able to wiggle toes without difficulty. No pain noted. No abscess noted.  Lab Results:   Recent Labs  10/12/14 0400 10/13/14 0602  WBC 14.0* 6.7  HGB 14.4 12.3  HCT 40.2 35.6*  PLT 165 148*   BMET  Recent Labs  10/13/14 0602 10/14/14 0628  NA 139 144  K 3.4* 3.1*  CL 100 100  CO2 29 32  GLUCOSE 209* 138*  BUN 21 15  CREATININE 1.08 0.79  CALCIUM 8.8 8.9   PT/INR No results for input(s): LABPROT, INR in the last 72 hours.  Studies/Results: [place] Venous Img Lower Bilateral  10/13/2014   CLINICAL DATA:  Redness and swelling of the legs bilaterally  EXAM: BILATERAL LOWER EXTREMITY VENOUS DOPPLER ULTRASOUND  TECHNIQUE: Gray-scale sonography with graded compression, as well as color Doppler and duplex ultrasound were performed to evaluate the lower extremity deep venous systems from the level of the common femoral vein and including the common femoral, femoral, profunda femoral, popliteal and calf veins including the posterior tibial, peroneal and gastrocnemius veins when visible. The superficial great saphenous vein was also interrogated. Spectral Doppler was utilized to evaluate flow at rest and with distal augmentation maneuvers in the common femoral, femoral and popliteal veins.  COMPARISON:  None.  FINDINGS: RIGHT LOWER EXTREMITY  Common  Femoral Vein: No evidence of thrombus. Normal compressibility, respiratory phasicity and response to augmentation.  Saphenofemoral Junction: No evidence of thrombus. Normal compressibility and flow on color Doppler imaging.  Profunda Femoral Vein: No evidence of thrombus. Normal compressibility and flow on color Doppler imaging.  Femoral Vein: No evidence of thrombus. Normal compressibility, respiratory phasicity and response to augmentation.  Popliteal Vein: No evidence of thrombus. Normal compressibility, respiratory phasicity and response to augmentation.  Calf Veins: No evidence of thrombus. Normal compressibility and flow on color Doppler imaging.  Superficial Great Saphenous Vein: No evidence of thrombus. Normal compressibility and flow on color Doppler imaging.  Venous Reflux:  None.  Other Findings:  Diffuse calf edema is noted.  LEFT LOWER EXTREMITY  Common Femoral Vein: No evidence of thrombus. Normal compressibility, respiratory phasicity and response to augmentation.  Saphenofemoral Junction: No evidence of thrombus. Normal compressibility and flow on color Doppler imaging.  Profunda Femoral Vein: No evidence of thrombus. Normal compressibility and flow on color Doppler imaging.  Femoral Vein: No evidence of thrombus. Normal compressibility, respiratory phasicity and response to augmentation.  Popliteal Vein: No evidence of thrombus. Normal compressibility, respiratory phasicity and response to augmentation.  Calf Veins: No evidence of thrombus. Normal compressibility and flow on color Doppler imaging.  Superficial Great Saphenous Vein: No evidence of thrombus. Normal compressibility and flow on color Doppler imaging.  Venous Reflux:  None.  Other Findings:  Diffuse calf edema is noted.  IMPRESSION: Bilateral calf edema is noted. No evidence of deep venous thrombosis is seen.   Electronically Signed   By: Eulah PontMark  Lukens M.D.  On: 10/13/2014 12:38    Anti-infectives: Anti-infectives    Start      Dose/Rate Route Frequency Ordered Stop   10/13/14 2200  piperacillin-tazobactam (ZOSYN) IVPB 3.375 g     3.375 g12.5 mL/hr over 240 Minutes Intravenous Every 8 hours 10/13/14 1724     10/12/14 1800  vancomycin (VANCOCIN) 1,500 mg in sodium chloride 0.9 % 500 mL IVPB     1,500 mg250 mL/hr over 120 Minutes Intravenous Every 24 hours 10/12/14 0859     10/12/14 1100  piperacillin-tazobactam (ZOSYN) IVPB 3.375 g  Status:  Discontinued     3.375 g12.5 mL/hr over 240 Minutes Intravenous Every 8 hours 10/12/14 0855 10/13/14 1724   10/12/14 0645  vancomycin (VANCOCIN) IVPB 1000 mg/200 mL premix     1,000 mg200 mL/hr over 60 Minutes Intravenous  Once 10/12/14 0632 10/12/14 0746   10/12/14 0645  piperacillin-tazobactam (ZOSYN) IVPB 3.375 g  Status:  Discontinued     3.375 g100 mL/hr over 30 Minutes Intravenous  Once 10/12/14 0632 10/12/14 0855   10/12/14 0345  clindamycin (CLEOCIN) IVPB 600 mg     600 mg100 mL/hr over 30 Minutes Intravenous  Once 10/12/14 0335 10/12/14 0452      Assessment/Plan: Impression: Cellulitis of right lower extremity, resolving. Plan: Continue IV antibiotics.  LOS: 2 days    Livier Hendel A 10/14/2014

## 2014-10-14 NOTE — Progress Notes (Signed)
Right lower extremity cellulitis and venous AND reveals no evidence of underlying DVT receiving IV vancomycin with Zosyn tissue appears more hyperemic today Angela [name] VQQ:[id number] DOB: 1933/05/24 DOA: 10/12/2014 PCP: Isabella StallingNDIEGO,Angela [name] M, MD             Physical Exam: Blood pressure 129/57, pulse 72, temperature 99.1 F (37.3 C), temperature source Oral, resp. rate 20, height 5\' 1"  (1.549 [name]), weight 190 lb (86.183 kg), SpO2 97 %. no JVD no carotid bruits no thyromegaly clinically vein origins rhonchi heart remains relatively irregular menstrual flow to above. No active right lower extremity equal height of erythema and hyperemia compared to yesterday   Investigations:  Recent Results (from the past 240 hour(s))  Blood culture (routine x 2)     Status: None (Preliminary result)   Collection Time: 10/12/14  4:00 AM  Result Value Ref Range Status   Specimen Description BLOOD RIGHT HAND  Final   Special Requests   Final    BOTTLES DRAWN AEROBIC AND ANAEROBIC AEB 8CC ANA 6CC   Culture NO GROWTH 2 DAYS  Final   Report Status PENDING  Incomplete  Blood culture (routine x 2)     Status: None (Preliminary result)   Collection Time: 10/12/14  4:05 AM  Result Value Ref Range Status   Specimen Description BLOOD LEFT ANTECUBITAL  Final   Special Requests BOTTLES DRAWN AEROBIC AND ANAEROBIC 10CC EACH  Final   Culture NO GROWTH 2 DAYS  Final   Report Status PENDING  Incomplete     Basic Metabolic Panel:  Recent Labs  43/32/9511/27/15 0602 10/14/14 0628  NA 139 144  K 3.4* 3.1*  CL 100 100  CO2 29 32  GLUCOSE 209* 138*  BUN 21 15  CREATININE 1.08 0.79  CALCIUM 8.8 8.9   Liver Function Tests:  Recent Labs  10/13/14 0602  AST 10  ALT 11  ALKPHOS 49  BILITOT 0.4  PROT 6.1  ALBUMIN 3.2*     CBC:  Recent Labs  10/12/14 0400 10/13/14 0602  WBC 14.0* 6.7  NEUTROABS 12.3*  --   HGB 14.4 12.3  HCT 40.2 35.6*  MCV 84.3 85.6  PLT 165 148*    [place] Venous Img Lower  Bilateral  10/13/2014   CLINICAL DATA:  Redness and swelling of the legs bilaterally  EXAM: BILATERAL LOWER EXTREMITY VENOUS DOPPLER ULTRASOUND  TECHNIQUE: Gray-scale sonography with graded compression, as well as color Doppler and duplex ultrasound were performed to evaluate the lower extremity deep venous systems from the level of the common femoral vein and including the common femoral, femoral, profunda femoral, popliteal and calf veins including the posterior tibial, peroneal and gastrocnemius veins when visible. The superficial great saphenous vein was also interrogated. Spectral Doppler was utilized to evaluate flow at rest and with distal augmentation maneuvers in the common femoral, femoral and popliteal veins.  COMPARISON:  None.  FINDINGS: RIGHT LOWER EXTREMITY  Common Femoral Vein: No evidence of thrombus. Normal compressibility, respiratory phasicity and response to augmentation.  Saphenofemoral Junction: No evidence of thrombus. Normal compressibility and flow on color Doppler imaging.  Profunda Femoral Vein: No evidence of thrombus. Normal compressibility and flow on color Doppler imaging.  Femoral Vein: No evidence of thrombus. Normal compressibility, respiratory phasicity and response to augmentation.  Popliteal Vein: No evidence of thrombus. Normal compressibility, respiratory phasicity and response to augmentation.  Calf Veins: No evidence of thrombus. Normal compressibility and flow on color Doppler imaging.  Superficial Great Saphenous Vein: No evidence of  thrombus. Normal compressibility and flow on color Doppler imaging.  Venous Reflux:  None.  Other Findings:  Diffuse calf edema is noted.  LEFT LOWER EXTREMITY  Common Femoral Vein: No evidence of thrombus. Normal compressibility, respiratory phasicity and response to augmentation.  Saphenofemoral Junction: No evidence of thrombus. Normal compressibility and flow on color Doppler imaging.  Profunda Femoral Vein: No evidence of thrombus.  Normal compressibility and flow on color Doppler imaging.  Femoral Vein: No evidence of thrombus. Normal compressibility, respiratory phasicity and response to augmentation.  Popliteal Vein: No evidence of thrombus. Normal compressibility, respiratory phasicity and response to augmentation.  Calf Veins: No evidence of thrombus. Normal compressibility and flow on color Doppler imaging.  Superficial Great Saphenous Vein: No evidence of thrombus. Normal compressibility and flow on color Doppler imaging.  Venous Reflux:  None.  Other Findings:  Diffuse calf edema is noted.  IMPRESSION: Bilateral calf edema is noted. No evidence of deep venous thrombosis is seen.   Electronically Signed   By: Alcide CleverMark  Lukens [name].D.   On: 10/13/2014 12:38      Medications:   Impression:  Active Problems:   Hypothyroidism   Essential hypertension   Heart disease   Cellulitis and abscess of leg     Plan: Continue IV vancomycin and Zosyn   Consultants: Surgery    Procedure   Antibiotics: Vancomycin and Zosyn                  Code Status:  Family Communication:    Disposition Plan is  Time spent: Whole 30 minutes   LOS: 2 days   Angela [name]   10/14/2014, 10:23 AM

## 2014-10-15 LAB — BASIC METABOLIC PANEL
ANION GAP: 12 (ref 5–15)
BUN: 12 mg/dL (ref 6–23)
CALCIUM: 9.2 mg/dL (ref 8.4–10.5)
CO2: 32 mEq/L (ref 19–32)
CREATININE: 0.72 mg/dL (ref 0.50–1.10)
Chloride: 100 mEq/L (ref 96–112)
GFR calc Af Amer: >90 mL/min (ref >90)
GFR calc non Af Amer: 80 mL/min — ABNORMAL LOW (ref >90)
Glucose, Bld: 166 mg/dL — ABNORMAL HIGH (ref 70–99)
Potassium: 3.3 mEq/L — ABNORMAL LOW (ref 3.7–5.3)
Sodium: 144 mEq/L (ref 137–147)

## 2014-10-15 LAB — VANCOMYCIN, TROUGH: Vancomycin Tr: 8.9 ug/mL — ABNORMAL LOW (ref 10.0–20.0)

## 2014-10-15 LAB — GLUCOSE, CAPILLARY
GLUCOSE-CAPILLARY: 168 mg/dL — AB (ref 70–99)
Glucose-Capillary: 189 mg/dL — ABNORMAL HIGH (ref 70–99)
Glucose-Capillary: 253 mg/dL — ABNORMAL HIGH (ref 70–99)
Glucose-Capillary: 248 mg/dL — ABNORMAL HIGH (ref 70–99)

## 2014-10-15 MED ORDER — FUROSEMIDE 40 MG PO TABS
40.0000 mg | ORAL_TABLET | Freq: Every day | ORAL | Status: DC
  Administered 2014-10-16: 40 mg via ORAL
  Filled 2014-10-15: qty 1

## 2014-10-15 NOTE — Progress Notes (Signed)
Patient continues to get out of bed without assistance.  Fall prevention education has been re-enforced multiple times but patient continues to be non-compliant.  Patient's bed is in lowest position, bed alarm on, yellow socks and yellow arm band on, call light and phone within reach.  Will continue to educate patient to call prior to getting out of bed.  Will continue to monitor patient.

## 2014-10-15 NOTE — Progress Notes (Signed)
Notified pharmacy of patient's vancomycin trough of 8.9.  Received instructions to give scheduled dose of vancomycin and next dose will be adjusted.  Will continue to monitor patient.

## 2014-10-15 NOTE — Plan of Care (Signed)
Problem: Discharge Progression Outcomes Goal: Tolerating diet Outcome: Completed/Met Date Met:  10/15/14     

## 2014-10-15 NOTE — Progress Notes (Signed)
Subjective: States her right lower extremity feels much better. No pain noted.  Objective: Vital signs in last 24 hours: Temp:  [98 F (36.7 C)-99 F (37.2 C)] 98 F (36.7 C) (11/29 1058) Pulse Rate:  [75-82] 79 (11/29 1058) Resp:  [20] 20 (11/29 0606) BP: (103-130)/(60-79) 103/71 mmHg (11/29 1058) SpO2:  [97 %-98 %] 97 % (11/29 1058) Last BM Date: 10/14/14  Intake/Output from previous day: 11/28 0701 - 11/29 0700 In: 1200 [P.O.:600; IV Piggyback:600] Out: 2700 [Urine:2700] Intake/Output this shift: Total I/O In: -  Out: 200 [Urine:200]  General appearance: alert, cooperative and no distress Extremities: Decreasing erythema and edema noted in the right lower extremity. Is able to wiggle toes without difficulty. Sensation intact.  Lab Results:   Recent Labs  10/13/14 0602  WBC 6.7  HGB 12.3  HCT 35.6*  PLT 148*   BMET  Recent Labs  10/14/14 0628 10/15/14 0524  NA 144 144  K 3.1* 3.3*  CL 100 100  CO2 32 32  GLUCOSE 138* 166*  BUN 15 12  CREATININE 0.79 0.72  CALCIUM 8.9 9.2   PT/INR No results for input(s): LABPROT, INR in the last 72 hours.  Studies/Results: [place] Venous Img Lower Bilateral  10/13/2014   CLINICAL DATA:  Redness and swelling of the legs bilaterally  EXAM: BILATERAL LOWER EXTREMITY VENOUS DOPPLER ULTRASOUND  TECHNIQUE: Gray-scale sonography with graded compression, as well as color Doppler and duplex ultrasound were performed to evaluate the lower extremity deep venous systems from the level of the common femoral vein and including the common femoral, femoral, profunda femoral, popliteal and calf veins including the posterior tibial, peroneal and gastrocnemius veins when visible. The superficial great saphenous vein was also interrogated. Spectral Doppler was utilized to evaluate flow at rest and with distal augmentation maneuvers in the common femoral, femoral and popliteal veins.  COMPARISON:  None.  FINDINGS: RIGHT LOWER EXTREMITY  Common  Femoral Vein: No evidence of thrombus. Normal compressibility, respiratory phasicity and response to augmentation.  Saphenofemoral Junction: No evidence of thrombus. Normal compressibility and flow on color Doppler imaging.  Profunda Femoral Vein: No evidence of thrombus. Normal compressibility and flow on color Doppler imaging.  Femoral Vein: No evidence of thrombus. Normal compressibility, respiratory phasicity and response to augmentation.  Popliteal Vein: No evidence of thrombus. Normal compressibility, respiratory phasicity and response to augmentation.  Calf Veins: No evidence of thrombus. Normal compressibility and flow on color Doppler imaging.  Superficial Great Saphenous Vein: No evidence of thrombus. Normal compressibility and flow on color Doppler imaging.  Venous Reflux:  None.  Other Findings:  Diffuse calf edema is noted.  LEFT LOWER EXTREMITY  Common Femoral Vein: No evidence of thrombus. Normal compressibility, respiratory phasicity and response to augmentation.  Saphenofemoral Junction: No evidence of thrombus. Normal compressibility and flow on color Doppler imaging.  Profunda Femoral Vein: No evidence of thrombus. Normal compressibility and flow on color Doppler imaging.  Femoral Vein: No evidence of thrombus. Normal compressibility, respiratory phasicity and response to augmentation.  Popliteal Vein: No evidence of thrombus. Normal compressibility, respiratory phasicity and response to augmentation.  Calf Veins: No evidence of thrombus. Normal compressibility and flow on color Doppler imaging.  Superficial Great Saphenous Vein: No evidence of thrombus. Normal compressibility and flow on color Doppler imaging.  Venous Reflux:  None.  Other Findings:  Diffuse calf edema is noted.  IMPRESSION: Bilateral calf edema is noted. No evidence of deep venous thrombosis is seen.   Electronically Signed   By: Loraine LericheMark  Lukens M.D.   On: 10/13/2014 12:38    Anti-infectives: Anti-infectives    Start      Dose/Rate Route Frequency Ordered Stop   10/13/14 2200  piperacillin-tazobactam (ZOSYN) IVPB 3.375 g     3.375 g12.5 mL/hr over 240 Minutes Intravenous Every 8 hours 10/13/14 1724     10/12/14 1800  vancomycin (VANCOCIN) 1,500 mg in sodium chloride 0.9 % 500 mL IVPB     1,500 mg250 mL/hr over 120 Minutes Intravenous Every 24 hours 10/12/14 0859     10/12/14 1100  piperacillin-tazobactam (ZOSYN) IVPB 3.375 g  Status:  Discontinued     3.375 g12.5 mL/hr over 240 Minutes Intravenous Every 8 hours 10/12/14 0855 10/13/14 1724   10/12/14 0645  vancomycin (VANCOCIN) IVPB 1000 mg/200 mL premix     1,000 mg200 mL/hr over 60 Minutes Intravenous  Once 10/12/14 0632 10/12/14 0746   10/12/14 0645  piperacillin-tazobactam (ZOSYN) IVPB 3.375 g  Status:  Discontinued     3.375 g100 mL/hr over 30 Minutes Intravenous  Once 10/12/14 [id number] 10/12/14 0855   10/12/14 0345  clindamycin (CLEOCIN) IVPB 600 mg     600 mg100 mL/hr over 30 Minutes Intravenous  Once 10/12/14 0335 10/12/14 0452      Assessment/Plan: Impression: Cellulitis right lower extremity, resolving Plan: Agree with IV vancomycin and Zosyn as an outpatient. Would advise noninvasive segmental Doppler study once this has resolved. Will follow peripherally with you.  LOS: 3 days    Angela [name] 10/15/2014

## 2014-10-15 NOTE — Progress Notes (Signed)
Right lower extremity cellulitis starting to recede no signs of septicemia patient comfortable potassium 3.3 will decrease Lasix to 40 by mouth daily to new [place] 20 mg by mouth twice a day check be met in a.m. Angela [name] ERD:[id number] DOB: May 03, 1933 DOA: 10/12/2014 PCP: Maricela Curet, MD             Physical Exam: Blood pressure 103/71, pulse 79, temperature 98 F (36.7 C), temperature source Oral, resp. rate 20, height 5' 1"  (1.549 m), weight 190 lb (86.183 kg), SpO2 97 %. no JVD no carotid bruits no thyromegaly lungs clear to A&P no rales wheeze or rhonchi appreciable heart regular rhythm S1-S2 of normal intensity no S3-S4 no heaves or thrills or rubs   Investigations:  Recent Results (from the past 240 hour(s))  Blood culture (routine x 2)     Status: None (Preliminary result)   Collection Time: 10/12/14  4:00 AM  Result Value Ref Range Status   Specimen Description BLOOD RIGHT HAND  Final   Special Requests   Final    BOTTLES DRAWN AEROBIC AND ANAEROBIC AEB 8CC ANA 6CC   Culture NO GROWTH 3 DAYS  Final   Report Status PENDING  Incomplete  Blood culture (routine x 2)     Status: None (Preliminary result)   Collection Time: 10/12/14  4:05 AM  Result Value Ref Range Status   Specimen Description BLOOD LEFT ANTECUBITAL  Final   Special Requests BOTTLES DRAWN AEROBIC AND ANAEROBIC 10CC EACH  Final   Culture NO GROWTH 3 DAYS  Final   Report Status PENDING  Incomplete     Basic Metabolic Panel:  Recent Labs  10/14/14 0628 10/15/14 0524  NA 144 144  K 3.1* 3.3*  CL 100 100  CO2 32 32  GLUCOSE 138* 166*  BUN 15 12  CREATININE 0.79 0.72  CALCIUM 8.9 9.2   Liver Function Tests:  Recent Labs  10/13/14 0602  AST 10  ALT 11  ALKPHOS 49  BILITOT 0.4  PROT 6.1  ALBUMIN 3.2*     CBC:  Recent Labs  10/13/14 0602  WBC 6.7  HGB 12.3  HCT 35.6*  MCV 85.6  PLT 148*    [place] Venous Img Lower Bilateral  10/13/2014   CLINICAL DATA:  Redness and  swelling of the legs bilaterally  EXAM: BILATERAL LOWER EXTREMITY VENOUS DOPPLER ULTRASOUND  TECHNIQUE: Gray-scale sonography with graded compression, as well as color Doppler and duplex ultrasound were performed to evaluate the lower extremity deep venous systems from the level of the common femoral vein and including the common femoral, femoral, profunda femoral, popliteal and calf veins including the posterior tibial, peroneal and gastrocnemius veins when visible. The superficial great saphenous vein was also interrogated. Spectral Doppler was utilized to evaluate flow at rest and with distal augmentation maneuvers in the common femoral, femoral and popliteal veins.  COMPARISON:  None.  FINDINGS: RIGHT LOWER EXTREMITY  Common Femoral Vein: No evidence of thrombus. Normal compressibility, respiratory phasicity and response to augmentation.  Saphenofemoral Junction: No evidence of thrombus. Normal compressibility and flow on color Doppler imaging.  Profunda Femoral Vein: No evidence of thrombus. Normal compressibility and flow on color Doppler imaging.  Femoral Vein: No evidence of thrombus. Normal compressibility, respiratory phasicity and response to augmentation.  Popliteal Vein: No evidence of thrombus. Normal compressibility, respiratory phasicity and response to augmentation.  Calf Veins: No evidence of thrombus. Normal compressibility and flow on color Doppler imaging.  Superficial Great Saphenous Vein: No  evidence of thrombus. Normal compressibility and flow on color Doppler imaging.  Venous Reflux:  None.  Other Findings:  Diffuse calf edema is noted.  LEFT LOWER EXTREMITY  Common Femoral Vein: No evidence of thrombus. Normal compressibility, respiratory phasicity and response to augmentation.  Saphenofemoral Junction: No evidence of thrombus. Normal compressibility and flow on color Doppler imaging.  Profunda Femoral Vein: No evidence of thrombus. Normal compressibility and flow on color Doppler imaging.   Femoral Vein: No evidence of thrombus. Normal compressibility, respiratory phasicity and response to augmentation.  Popliteal Vein: No evidence of thrombus. Normal compressibility, respiratory phasicity and response to augmentation.  Calf Veins: No evidence of thrombus. Normal compressibility and flow on color Doppler imaging.  Superficial Great Saphenous Vein: No evidence of thrombus. Normal compressibility and flow on color Doppler imaging.  Venous Reflux:  None.  Other Findings:  Diffuse calf edema is noted.  IMPRESSION: Bilateral calf edema is noted. No evidence of deep venous thrombosis is seen.   Electronically Signed   By: Inez Catalina M.D.   On: 10/13/2014 12:38      Medications:   Impression: Hypokalemia 3.3 Active Problems:   Hypothyroidism   Essential hypertension   Heart disease   Cellulitis and abscess of leg     Plan: Continue vancomycin and Zosyn be met in a.m. decrease Lasix to 40 daily  Consultants: Surgery   Procedures   Antibiotics: Vancomycin and Zosyn                  Code Status:  Family Communication:    Disposition Plan continue antibiotics Doppler venous exams are negative increase ambulation  Time spent: 30 minutes   LOS: 3 days   Angela [name] M   10/15/2014, 11:44 AM

## 2014-10-15 NOTE — Plan of Care (Signed)
Problem: Phase III Progression Outcomes Goal: Activity at appropriate level-compared to baseline (UP IN CHAIR FOR HEMODIALYSIS)  Outcome: Completed/Met Date Met:  10/15/14 Goal: Temperature < 100 Outcome: Completed/Met Date Met:  10/15/14

## 2014-10-16 LAB — BASIC METABOLIC PANEL
Anion gap: 15 (ref 5–15)
BUN: 21 mg/dL (ref 6–23)
CO2: 29 meq/L (ref 19–32)
Calcium: 9.6 mg/dL (ref 8.4–10.5)
Chloride: 99 mEq/L (ref 96–112)
Creatinine, Ser: 0.76 mg/dL (ref 0.50–1.10)
GFR calc Af Amer: >90 mL/min (ref >90)
GFR calc non Af Amer: 78 mL/min — ABNORMAL LOW (ref >90)
GLUCOSE: 173 mg/dL — AB (ref 70–99)
Potassium: 4 mEq/L (ref 3.7–5.3)
SODIUM: 143 meq/L (ref 137–147)

## 2014-10-16 LAB — GLUCOSE, CAPILLARY
GLUCOSE-CAPILLARY: 179 mg/dL — AB (ref 70–99)
Glucose-Capillary: 234 mg/dL — ABNORMAL HIGH (ref 70–99)

## 2014-10-16 MED ORDER — POTASSIUM CHLORIDE CRYS ER 20 MEQ PO TBCR: 20.0000 meq | EXTENDED_RELEASE_TABLET | Freq: Two times a day (BID) | ORAL | Status: AC

## 2014-10-16 MED ORDER — CEPHALEXIN 500 MG PO CAPS: 500.0000 mg | ORAL_CAPSULE | Freq: Four times a day (QID) | ORAL | Status: DC

## 2014-10-16 MED ORDER — DOXYCYCLINE HYCLATE 100 MG PO TABS: 100.0000 mg | ORAL_TABLET | Freq: Two times a day (BID) | ORAL | Status: DC

## 2014-10-16 MED ORDER — VANCOMYCIN HCL 10 G IV SOLR
2000.0000 mg | INTRAVENOUS | Status: DC
  Filled 2014-10-16 (×2): qty 2000

## 2014-10-16 MED ORDER — FUROSEMIDE 40 MG PO TABS: 40.0000 mg | ORAL_TABLET | Freq: Every day | ORAL | Status: DC | PRN

## 2014-10-16 NOTE — Discharge Summary (Signed)
Physician Discharge Summary  Angela [name] ZOX:[id number] DOB: 22-Mar-1933 DOA: 10/12/2014  PCP: Isabella Stalling, MD  Admit date: 10/12/2014 Discharge date: 10/16/2014   Recommendations for Outpatient Follow-up:  Follow-up in office in 4 days time. She is to take Keflex 500 mg by mouth 4 times a day for 7 days along with doxycycline 100 mg by mouth twice a day for 7 days  Discharge Diagnoses:  Active Problems:   Hypothyroidism   Essential hypertension   Heart disease   Cellulitis and abscess of leg   Discharge Condition: Good  Filed Weights   10/12/14 0303 10/12/14 0846  Weight: 190 lb (86.183 kg) 190 lb (86.183 kg)    History of present illness:  Patient is a known diabetic hypertensive was minimal coronary artery disease and hypothyroidism presented with rapidly ascending right lower extremity cellulitis placed on vancomycin and Zosyn venous Dopplers were performed to rule out DVT and she had rapid resolution of cellulitis and continue by mouth antibiotics as patient's insistence on leaving hospital after discussion with ID and primary pharmacy  Hospital Course:  Patient had IV Vanco and Zosyn after 5 days switched to by mouth doxycycline and Keflex for an additional 7 days  Procedures:    Consultations:  Surgery  Discharge Instructions  Discharge Instructions    Discharge instructions    Complete by:  As directed      Discharge patient    Complete by:  As directed             Medication List    STOP taking these medications        montelukast 10 MG tablet  Commonly known as:  SINGULAIR      TAKE these medications        albuterol 108 (90 BASE) MCG/ACT inhaler  Commonly known as:  PROVENTIL HFA;VENTOLIN HFA  Inhale 2 puffs into the lungs every 6 (six) hours as needed for wheezing or shortness of breath.     aspirin 81 MG tablet  Take 81 mg by mouth daily.     cephALEXin 500 MG capsule  Commonly known as:  KEFLEX  Take 1 capsule (500 mg  total) by mouth every 6 (six) hours.     cholecalciferol 1000 UNITS tablet  Commonly known as:  VITAMIN D  Take 2,000 Units by mouth daily.     doxycycline 100 MG tablet  Commonly known as:  VIBRA-TABS  Take 1 tablet (100 mg total) by mouth 2 (two) times daily.     furosemide 40 MG tablet  Commonly known as:  LASIX  Take 1 tablet (40 mg total) by mouth daily as needed for fluid.     glipiZIDE 5 MG tablet  Commonly known as:  GLUCOTROL  Take 5 mg by mouth 2 (two) times daily before a meal.     ibuprofen 800 MG tablet  Commonly known as:  ADVIL,MOTRIN  Take 800 mg by mouth every 8 (eight) hours as needed for moderate pain.     ipratropium-albuterol 0.5-2.5 (3) MG/3ML Soln  Commonly known as:  DUONEB  Take 3 mLs by nebulization every 6 (six) hours as needed (WHEEZING/SHORTNESS OF BREATH).     isosorbide mononitrate 30 MG 24 hr tablet  Commonly known as:  IMDUR  TAKE ONE TABLET TWICE DAILY     lansoprazole 30 MG capsule  Commonly known as:  PREVACID  Take 1 capsule (30 mg total) by mouth daily.     levothyroxine 75 MCG tablet  Commonly  known as:  SYNTHROID, LEVOTHROID  Take 75 mcg by mouth daily.     lisinopril 40 MG tablet  Commonly known as:  PRINIVIL,ZESTRIL  Take 1 tablet (40 mg total) by mouth daily.     metoprolol succinate 100 MG 24 hr tablet  Commonly known as:  TOPROL-XL  TAKE ONE MG IN THE AM AND 5OMG IN THE PM     NITROSTAT 0.4 MG SL tablet  Generic drug:  nitroGLYCERIN  Place 0.4 mg under the tongue every 5 (five) minutes as needed for chest pain.     potassium chloride SA 20 MEQ tablet  Commonly known as:  K-DUR,KLOR-CON  Take 1 tablet (20 mEq total) by mouth 2 (two) times daily.     tolterodine 4 MG 24 hr capsule  Commonly known as:  DETROL LA  Take 4 mg by mouth daily.     traMADol 50 MG tablet  Commonly known as:  ULTRAM  Take 50 mg by mouth every 6 (six) hours as needed for moderate pain. Maximum dose= 8 tablets per day       Allergies    Allergen Reactions  . Statins Other (See Comments)    myalgias  . Esomeprazole Magnesium     REACTION: rash  . Morphine     REACTION: UNKNOWN REACTION      The results of significant diagnostics from this hospitalization (including imaging, microbiology, ancillary and laboratory) are listed below for reference.    Significant Diagnostic Studies: Dg Tibia/fibula Right  10/12/2014   CLINICAL DATA:  Right leg redness and swelling.  EXAM: RIGHT TIBIA AND FIBULA - 2 VIEW  COMPARISON:  None.  FINDINGS: Diffuse nonspecific subcutaneous reticulation of the leg. This could represent edema or cellulitis. There is no soft tissue gas or radiopaque foreign body. No acute osseous findings. Knee osteoarthritis with moderate to advanced joint narrowing. Osteopenia, which accounts for lucent changes in the fibular more than tibial cortex.  IMPRESSION: Nonspecific leg edema. No subcutaneous gas or radiopaque foreign body.   Electronically Signed   By: Tiburcio PeaJonathan  Watts M.D.   On: 10/12/2014 04:47   [place] Venous Img Lower Bilateral  10/13/2014   CLINICAL DATA:  Redness and swelling of the legs bilaterally  EXAM: BILATERAL LOWER EXTREMITY VENOUS DOPPLER ULTRASOUND  TECHNIQUE: Gray-scale sonography with graded compression, as well as color Doppler and duplex ultrasound were performed to evaluate the lower extremity deep venous systems from the level of the common femoral vein and including the common femoral, femoral, profunda femoral, popliteal and calf veins including the posterior tibial, peroneal and gastrocnemius veins when visible. The superficial great saphenous vein was also interrogated. Spectral Doppler was utilized to evaluate flow at rest and with distal augmentation maneuvers in the common femoral, femoral and popliteal veins.  COMPARISON:  None.  FINDINGS: RIGHT LOWER EXTREMITY  Common Femoral Vein: No evidence of thrombus. Normal compressibility, respiratory phasicity and response to augmentation.   Saphenofemoral Junction: No evidence of thrombus. Normal compressibility and flow on color Doppler imaging.  Profunda Femoral Vein: No evidence of thrombus. Normal compressibility and flow on color Doppler imaging.  Femoral Vein: No evidence of thrombus. Normal compressibility, respiratory phasicity and response to augmentation.  Popliteal Vein: No evidence of thrombus. Normal compressibility, respiratory phasicity and response to augmentation.  Calf Veins: No evidence of thrombus. Normal compressibility and flow on color Doppler imaging.  Superficial Great Saphenous Vein: No evidence of thrombus. Normal compressibility and flow on color Doppler imaging.  Venous Reflux:  None.  Other Findings:  Diffuse calf edema is noted.  LEFT LOWER EXTREMITY  Common Femoral Vein: No evidence of thrombus. Normal compressibility, respiratory phasicity and response to augmentation.  Saphenofemoral Junction: No evidence of thrombus. Normal compressibility and flow on color Doppler imaging.  Profunda Femoral Vein: No evidence of thrombus. Normal compressibility and flow on color Doppler imaging.  Femoral Vein: No evidence of thrombus. Normal compressibility, respiratory phasicity and response to augmentation.  Popliteal Vein: No evidence of thrombus. Normal compressibility, respiratory phasicity and response to augmentation.  Calf Veins: No evidence of thrombus. Normal compressibility and flow on color Doppler imaging.  Superficial Great Saphenous Vein: No evidence of thrombus. Normal compressibility and flow on color Doppler imaging.  Venous Reflux:  None.  Other Findings:  Diffuse calf edema is noted.  IMPRESSION: Bilateral calf edema is noted. No evidence of deep venous thrombosis is seen.   Electronically Signed   By: Alcide CleverMark  Lukens M.D.   On: 10/13/2014 12:38    Microbiology: Recent Results (from the past 240 hour(s))  Blood culture (routine x 2)     Status: None (Preliminary result)   Collection Time: 10/12/14  4:00 AM    Result Value Ref Range Status   Specimen Description BLOOD RIGHT HAND  Final   Special Requests   Final    BOTTLES DRAWN AEROBIC AND ANAEROBIC AEB 8CC ANA 6CC   Culture NO GROWTH 4 DAYS  Final   Report Status PENDING  Incomplete  Blood culture (routine x 2)     Status: None (Preliminary result)   Collection Time: 10/12/14  4:05 AM  Result Value Ref Range Status   Specimen Description BLOOD LEFT ANTECUBITAL  Final   Special Requests BOTTLES DRAWN AEROBIC AND ANAEROBIC 10CC EACH  Final   Culture NO GROWTH 4 DAYS  Final   Report Status PENDING  Incomplete     Labs: Basic Metabolic Panel:  Recent Labs Lab 10/12/14 0400 10/13/14 0602 10/14/14 0628 10/15/14 0524 10/16/14 0640  NA 137 139 144 144 143  K 3.7 3.4* 3.1* 3.3* 4.0  CL 97 100 100 100 99  CO2 22 29 32 32 29  GLUCOSE 281* 209* 138* 166* 173*  BUN 24* 21 15 12 21   CREATININE 0.70 1.08 0.79 0.72 0.76  CALCIUM 9.2 8.8 8.9 9.2 9.6   Liver Function Tests:  Recent Labs Lab 10/13/14 0602  AST 10  ALT 11  ALKPHOS 49  BILITOT 0.4  PROT 6.1  ALBUMIN 3.2*   No results for input(s): LIPASE, AMYLASE in the last 168 hours. No results for input(s): AMMONIA in the last 168 hours. CBC:  Recent Labs Lab 10/12/14 0400 10/13/14 0602  WBC 14.0* 6.7  NEUTROABS 12.3*  --   HGB 14.4 12.3  HCT 40.2 35.6*  MCV 84.3 85.6  PLT 165 148*   Cardiac Enzymes: No results for input(s): CKTOTAL, CKMB, CKMBINDEX, TROPONINI in the last 168 hours. BNP: BNP (last 3 results) No results for input(s): PROBNP in the last 8760 hours. CBG:  Recent Labs Lab 10/15/14 1132 10/15/14 1635 10/15/14 2100 10/16/14 0743 10/16/14 1130  GLUCAP 253* 248* 168* 179* 234*       Signed:  Allicia Culley M  [facility] Pager: [phone] 10/16/2014, 1:15 PM

## 2014-10-16 NOTE — Progress Notes (Signed)
Inpatient Diabetes Program Recommendations  AACE/ADA: New Consensus Statement on Inpatient Glycemic Control (2013)  Target Ranges:  Prepandial:   less than 140 mg/dL      Peak postprandial:   less than 180 mg/dL (1-2 hours)      Critically ill patients:  140 - 180 mg/dL   Results for Angela [name], Marketta C (MRN [id number]) as of 10/16/2014 08:58  Ref. Range 10/15/2014 07:54 10/15/2014 11:32 10/15/2014 16:35 10/15/2014 21:00 10/16/2014 07:43  Glucose-Capillary Latest Range: 70-99 mg/dL 829189 (H) 562253 (H) 130248 (H) 168 (H) 179 (H)   Diabetes history: DM2 Outpatient Diabetes medications: Glipizide 5 mg BID Current orders for Inpatient glycemic control: Glipizide 10 mg BID, Novolog 0-9 units AC, Novolog 0-5 units HS  Inpatient Diabetes Program Recommendations Insulin - Meal Coverage: Noted post prandial glucose is consistently elevated. May want to consider ordering Novolog 3 units TID with meals for meal coverage if patient eats at least 50% of meal.  Thanks, Orlando PennerMarie Branton Einstein, RN, MSN, CCRN, CDE Diabetes Coordinator Inpatient Diabetes Program [phone] (Team Pager) [phone] ([facility] office) [phone] [facility] office)

## 2014-10-16 NOTE — Care Management Utilization Note (Signed)
UR complete 

## 2014-10-16 NOTE — Progress Notes (Signed)
Discharge instructions reviewed with pt. No current questions or concerns. Prescriptions reviewed. Pt will be leaving shortly and says she will see Dr. Janna Archondiego this Friday

## 2014-10-16 NOTE — Progress Notes (Signed)
ANTIBIOTIC CONSULT NOTE - FOLLOW UP  Pharmacy Consult for Vancomycin & Zosyn Indication: cellultis  Allergies  Allergen Reactions  . Statins Other (See Comments)    myalgias  . Esomeprazole Magnesium     REACTION: rash  . Morphine     REACTION: UNKNOWN REACTION    Patient Measurements: Height: 5\' 1"  (154.9 cm) (5'1") Weight: 190 lb (86.183 kg) IBW/kg (Calculated) : 47.8  Vital Signs: Temp: 98.2 F (36.8 C) (11/30 0704) Temp Source: Oral (11/30 0704) BP: 153/70 mmHg (11/30 0704) Pulse Rate: 66 (11/30 0704) Intake/Output from previous day: 11/29 0701 - 11/30 0700 In: 960 [P.O.:360; IV Piggyback:600] Out: 700 [Urine:700] Intake/Output from this shift:    Labs:  Recent Labs  10/14/14 0628 10/15/14 0524 10/16/14 0640  CREATININE 0.79 0.72 0.76   Estimated Creatinine Clearance: 56.9 mL/min (by C-G formula based on Cr of 0.76).  Recent Labs  10/15/14 1705  VANCOTROUGH 8.9*     Microbiology: Recent Results (from the past 720 hour(s))  Blood culture (routine x 2)     Status: None (Preliminary result)   Collection Time: 10/12/14  4:00 AM  Result Value Ref Range Status   Specimen Description BLOOD RIGHT HAND  Final   Special Requests   Final    BOTTLES DRAWN AEROBIC AND ANAEROBIC AEB 8CC ANA 6CC   Culture NO GROWTH 3 DAYS  Final   Report Status PENDING  Incomplete  Blood culture (routine x 2)     Status: None (Preliminary result)   Collection Time: 10/12/14  4:05 AM  Result Value Ref Range Status   Specimen Description BLOOD LEFT ANTECUBITAL  Final   Special Requests BOTTLES DRAWN AEROBIC AND ANAEROBIC 10CC EACH  Final   Culture NO GROWTH 3 DAYS  Final   Report Status PENDING  Incomplete    Anti-infectives    Start     Dose/Rate Route Frequency Ordered Stop   10/16/14 1400  vancomycin (VANCOCIN) 2,000 mg in sodium chloride 0.9 % 500 mL IVPB     2,000 mg250 mL/hr over 120 Minutes Intravenous Every 24 hours 10/16/14 0833     10/13/14 2200   piperacillin-tazobactam (ZOSYN) IVPB 3.375 g     3.375 g12.5 mL/hr over 240 Minutes Intravenous Every 8 hours 10/13/14 1724     10/12/14 1800  vancomycin (VANCOCIN) 1,500 mg in sodium chloride 0.9 % 500 mL IVPB  Status:  Discontinued     1,500 mg250 mL/hr over 120 Minutes Intravenous Every 24 hours 10/12/14 0859 10/16/14 0833   10/12/14 1100  piperacillin-tazobactam (ZOSYN) IVPB 3.375 g  Status:  Discontinued     3.375 g12.5 mL/hr over 240 Minutes Intravenous Every 8 hours 10/12/14 0855 10/13/14 1724   10/12/14 0645  vancomycin (VANCOCIN) IVPB 1000 mg/200 mL premix     1,000 mg200 mL/hr over 60 Minutes Intravenous  Once 10/12/14 0632 10/12/14 0746   10/12/14 0645  piperacillin-tazobactam (ZOSYN) IVPB 3.375 g  Status:  Discontinued     3.375 g100 mL/hr over 30 Minutes Intravenous  Once 10/12/14 [id number] 10/12/14 0855   10/12/14 0345  clindamycin (CLEOCIN) IVPB 600 mg     600 mg100 mL/hr over 30 Minutes Intravenous  Once 10/12/14 0335 10/12/14 0452      Assessment: 80 yo diabetic F admitted with worsening redness & swelling right shin.   She remains afebrile with normal WBC.   Renal function stable.  Vancomycin trough slightly <goal range today.   Vancomycin 11/26>> Zosyn 11/26>>  Goal of Therapy:  Vancomycin trough level  10-15 mcg/ml  Plan:  Continue Zosyn 3.375gm IV Q8h to be infused over 4hrs Increase Vancomycin to 2gm IV q24h Weekly Vancomycin trough while on Vancomycin Monitor renal function and cx data  Duration of therapy per MD- [facility].  Recommend Doxycycline 100mg  po BID and Keflex 500mg  Q8h at discharge.    Elson ClanLilliston, Aine Strycharz Michelle 10/16/2014,8:35 AM

## 2014-10-17 LAB — CULTURE, BLOOD (ROUTINE X 2)
CULTURE: NO GROWTH
Culture: NO GROWTH

## 2014-11-01 ENCOUNTER — Inpatient Hospital Stay (HOSPITAL_COMMUNITY): Payer: Medicare Other

## 2014-11-01 ENCOUNTER — Encounter (HOSPITAL_COMMUNITY): Payer: Self-pay | Admitting: *Deleted

## 2014-11-01 ENCOUNTER — Inpatient Hospital Stay (HOSPITAL_COMMUNITY)
Admission: EM | Admit: 2014-11-01 | Discharge: 2014-11-06 | DRG: 603 | Disposition: A | Discharge status: Home Health | Payer: Medicare Other | Attending: Family Medicine | Admitting: Family Medicine

## 2014-11-01 DIAGNOSIS — L03115 Cellulitis of right lower limb: Principal | ICD-10-CM | POA: Diagnosis present

## 2014-11-01 DIAGNOSIS — L0390 Cellulitis, unspecified: Secondary | ICD-10-CM | POA: Diagnosis present

## 2014-11-01 DIAGNOSIS — R609 Edema, unspecified: Secondary | ICD-10-CM

## 2014-11-01 DIAGNOSIS — M8580 Other specified disorders of bone density and structure, unspecified site: Secondary | ICD-10-CM | POA: Diagnosis present

## 2014-11-01 DIAGNOSIS — M1990 Unspecified osteoarthritis, unspecified site: Secondary | ICD-10-CM | POA: Diagnosis present

## 2014-11-01 DIAGNOSIS — Z79899 Other long term (current) drug therapy: Secondary | ICD-10-CM

## 2014-11-01 DIAGNOSIS — E032 Hypothyroidism due to medicaments and other exogenous substances: Secondary | ICD-10-CM

## 2014-11-01 DIAGNOSIS — Z7982 Long term (current) use of aspirin: Secondary | ICD-10-CM

## 2014-11-01 DIAGNOSIS — Z801 Family history of malignant neoplasm of trachea, bronchus and lung: Secondary | ICD-10-CM | POA: Diagnosis not present

## 2014-11-01 DIAGNOSIS — Z800 Family history of malignant neoplasm of digestive organs: Secondary | ICD-10-CM

## 2014-11-01 DIAGNOSIS — L03031 Cellulitis of right toe: Secondary | ICD-10-CM

## 2014-11-01 DIAGNOSIS — J45909 Unspecified asthma, uncomplicated: Secondary | ICD-10-CM | POA: Diagnosis present

## 2014-11-01 DIAGNOSIS — E039 Hypothyroidism, unspecified: Secondary | ICD-10-CM | POA: Diagnosis present

## 2014-11-01 DIAGNOSIS — E7800 Pure hypercholesterolemia, unspecified: Secondary | ICD-10-CM | POA: Diagnosis present

## 2014-11-01 DIAGNOSIS — K219 Gastro-esophageal reflux disease without esophagitis: Secondary | ICD-10-CM | POA: Diagnosis present

## 2014-11-01 DIAGNOSIS — I10 Essential (primary) hypertension: Secondary | ICD-10-CM | POA: Diagnosis present

## 2014-11-01 DIAGNOSIS — K589 Irritable bowel syndrome without diarrhea: Secondary | ICD-10-CM | POA: Diagnosis present

## 2014-11-01 DIAGNOSIS — E0800 Diabetes mellitus due to underlying condition with hyperosmolarity without nonketotic hyperglycemic-hyperosmolar coma (NKHHC): Secondary | ICD-10-CM

## 2014-11-01 DIAGNOSIS — Z791 Long term (current) use of non-steroidal anti-inflammatories (NSAID): Secondary | ICD-10-CM

## 2014-11-01 DIAGNOSIS — E119 Type 2 diabetes mellitus without complications: Secondary | ICD-10-CM

## 2014-11-01 DIAGNOSIS — I2510 Atherosclerotic heart disease of native coronary artery without angina pectoris: Secondary | ICD-10-CM | POA: Diagnosis present

## 2014-11-01 DIAGNOSIS — K760 Fatty (change of) liver, not elsewhere classified: Secondary | ICD-10-CM | POA: Diagnosis present

## 2014-11-01 DIAGNOSIS — J449 Chronic obstructive pulmonary disease, unspecified: Secondary | ICD-10-CM | POA: Diagnosis present

## 2014-11-01 LAB — COMPREHENSIVE METABOLIC PANEL
ALBUMIN: 4 g/dL (ref 3.5–5.2)
ALT: 11 U/L (ref 0–35)
ANION GAP: 11 (ref 5–15)
AST: 13 U/L (ref 0–37)
Alkaline Phosphatase: 57 U/L (ref 39–117)
BILIRUBIN TOTAL: 0.7 mg/dL (ref 0.3–1.2)
BUN: 17 mg/dL (ref 6–23)
CHLORIDE: 95 meq/L — AB (ref 96–112)
CO2: 30 mEq/L (ref 19–32)
CREATININE: 0.79 mg/dL (ref 0.50–1.10)
Calcium: 9.5 mg/dL (ref 8.4–10.5)
GFR calc Af Amer: 89 mL/min — ABNORMAL LOW (ref >90)
GFR calc non Af Amer: 77 mL/min — ABNORMAL LOW (ref >90)
Glucose, Bld: 243 mg/dL — ABNORMAL HIGH (ref 70–99)
Potassium: 4.3 mEq/L (ref 3.7–5.3)
Sodium: 136 mEq/L — ABNORMAL LOW (ref 137–147)
Total Protein: 7.2 g/dL (ref 6.0–8.3)

## 2014-11-01 LAB — CBC WITH DIFFERENTIAL/PLATELET
BASOS PCT: 0 % (ref 0–1)
Basophils Absolute: 0 10*3/uL (ref 0.0–0.1)
EOS PCT: 0 % (ref 0–5)
Eosinophils Absolute: 0 10*3/uL (ref 0.0–0.7)
HEMATOCRIT: 39.2 % (ref 36.0–46.0)
Hemoglobin: 13.4 g/dL (ref 12.0–15.0)
Lymphocytes Relative: 7 % — ABNORMAL LOW (ref 12–46)
Lymphs Abs: 0.9 10*3/uL (ref 0.7–4.0)
MCH: 29.5 pg (ref 26.0–34.0)
MCHC: 34.2 g/dL (ref 30.0–36.0)
MCV: 86.2 fL (ref 78.0–100.0)
MONO ABS: 0.8 10*3/uL (ref 0.1–1.0)
MONOS PCT: 6 % (ref 3–12)
NEUTROS ABS: 11.4 10*3/uL — AB (ref 1.7–7.7)
Neutrophils Relative %: 87 % — ABNORMAL HIGH (ref 43–77)
Platelets: 189 10*3/uL (ref 150–400)
RBC: 4.55 MIL/uL (ref 3.87–5.11)
RDW: 13.1 % (ref 11.5–15.5)
WBC: 13.1 10*3/uL — ABNORMAL HIGH (ref 4.0–10.5)

## 2014-11-01 LAB — GLUCOSE, CAPILLARY
Glucose-Capillary: 209 mg/dL — ABNORMAL HIGH (ref 70–99)
Glucose-Capillary: 157 mg/dL — ABNORMAL HIGH (ref 70–99)

## 2014-11-01 LAB — PRO B NATRIURETIC PEPTIDE: Pro B Natriuretic peptide (BNP): 772.9 pg/mL — ABNORMAL HIGH (ref 0–450)

## 2014-11-01 MED ORDER — ENOXAPARIN SODIUM 40 MG/0.4ML ~~LOC~~ SOLN
40.0000 mg | Freq: Every day | SUBCUTANEOUS | Status: DC
  Administered 2014-11-01 – 2014-11-06 (×6): 40 mg via SUBCUTANEOUS
  Filled 2014-11-01 (×6): qty 0.4

## 2014-11-01 MED ORDER — VANCOMYCIN HCL IN DEXTROSE 1-5 GM/200ML-% IV SOLN
1000.0000 mg | Freq: Once | INTRAVENOUS | Status: AC
  Administered 2014-11-01: 1000 mg via INTRAVENOUS
  Filled 2014-11-01: qty 200

## 2014-11-01 MED ORDER — IPRATROPIUM-ALBUTEROL 0.5-2.5 (3) MG/3ML IN SOLN: 3.0000 mL | Freq: Four times a day (QID) | RESPIRATORY_TRACT | Status: DC | PRN

## 2014-11-01 MED ORDER — GLIPIZIDE 5 MG PO TABS
5.0000 mg | ORAL_TABLET | Freq: Two times a day (BID) | ORAL | Status: DC
  Administered 2014-11-01 – 2014-11-06 (×10): 5 mg via ORAL
  Filled 2014-11-01 (×10): qty 1

## 2014-11-01 MED ORDER — INSULIN ASPART 100 UNIT/ML ~~LOC~~ SOLN
0.0000 [IU] | Freq: Three times a day (TID) | SUBCUTANEOUS | Status: DC
  Administered 2014-11-01: 5 [IU] via SUBCUTANEOUS
  Administered 2014-11-02: 3 [IU] via SUBCUTANEOUS
  Administered 2014-11-02: 2 [IU] via SUBCUTANEOUS
  Administered 2014-11-03: 3 [IU] via SUBCUTANEOUS
  Administered 2014-11-03: 2 [IU] via SUBCUTANEOUS
  Administered 2014-11-03: 5 [IU] via SUBCUTANEOUS
  Administered 2014-11-04: 2 [IU] via SUBCUTANEOUS
  Administered 2014-11-04 (×2): 3 [IU] via SUBCUTANEOUS
  Administered 2014-11-05: 2 [IU] via SUBCUTANEOUS
  Administered 2014-11-05: 3 [IU] via SUBCUTANEOUS
  Administered 2014-11-05: 2 [IU] via SUBCUTANEOUS
  Administered 2014-11-06: 3 [IU] via SUBCUTANEOUS
  Administered 2014-11-06: 2 [IU] via SUBCUTANEOUS

## 2014-11-01 MED ORDER — HYDROCODONE-ACETAMINOPHEN 5-325 MG PO TABS
1.0000 | ORAL_TABLET | ORAL | Status: DC | PRN
  Administered 2014-11-01 – 2014-11-04 (×4): 2 via ORAL
  Filled 2014-11-01 (×5): qty 2

## 2014-11-01 MED ORDER — INSULIN ASPART 100 UNIT/ML ~~LOC~~ SOLN
0.0000 [IU] | Freq: Every day | SUBCUTANEOUS | Status: DC
  Administered 2014-11-02: 0.3 [IU] via SUBCUTANEOUS

## 2014-11-01 MED ORDER — SODIUM CHLORIDE 0.9 % IV SOLN: 250.0000 mL | INTRAVENOUS | Status: DC | PRN

## 2014-11-01 MED ORDER — LISINOPRIL 10 MG PO TABS
40.0000 mg | ORAL_TABLET | Freq: Every day | ORAL | Status: DC
  Administered 2014-11-01 – 2014-11-06 (×6): 40 mg via ORAL
  Filled 2014-11-01 (×6): qty 4

## 2014-11-01 MED ORDER — BISACODYL 10 MG RE SUPP: 10.0000 mg | Freq: Every day | RECTAL | Status: DC | PRN

## 2014-11-01 MED ORDER — FESOTERODINE FUMARATE ER 4 MG PO TB24
4.0000 mg | ORAL_TABLET | Freq: Every day | ORAL | Status: DC
Start: 2014-11-01 — End: 2014-11-06
  Administered 2014-11-01 – 2014-11-06 (×6): 4 mg via ORAL
  Filled 2014-11-01 (×8): qty 1

## 2014-11-01 MED ORDER — ONDANSETRON HCL 4 MG PO TABS: 4.0000 mg | ORAL_TABLET | Freq: Four times a day (QID) | ORAL | Status: DC | PRN

## 2014-11-01 MED ORDER — VANCOMYCIN HCL IN DEXTROSE 1-5 GM/200ML-% IV SOLN
1000.0000 mg | Freq: Two times a day (BID) | INTRAVENOUS | Status: DC
  Administered 2014-11-01 – 2014-11-06 (×10): 1000 mg via INTRAVENOUS
  Filled 2014-11-01 (×12): qty 200

## 2014-11-01 MED ORDER — ALBUTEROL SULFATE HFA 108 (90 BASE) MCG/ACT IN AERS: 2.0000 | INHALATION_SPRAY | Freq: Four times a day (QID) | RESPIRATORY_TRACT | Status: DC | PRN

## 2014-11-01 MED ORDER — IBUPROFEN 800 MG PO TABS: 800.0000 mg | ORAL_TABLET | Freq: Three times a day (TID) | ORAL | Status: DC | PRN

## 2014-11-01 MED ORDER — MORPHINE SULFATE 2 MG/ML IJ SOLN: 1.0000 mg | INTRAMUSCULAR | Status: DC | PRN

## 2014-11-01 MED ORDER — TRAZODONE HCL 50 MG PO TABS: 25.0000 mg | ORAL_TABLET | Freq: Every evening | ORAL | Status: DC | PRN

## 2014-11-01 MED ORDER — VANCOMYCIN HCL IN DEXTROSE 1-5 GM/200ML-% IV SOLN
INTRAVENOUS | Status: AC
  Filled 2014-11-01: qty 200

## 2014-11-01 MED ORDER — ISOSORBIDE MONONITRATE ER 60 MG PO TB24
30.0000 mg | ORAL_TABLET | Freq: Two times a day (BID) | ORAL | Status: DC
  Administered 2014-11-01 – 2014-11-06 (×11): 30 mg via ORAL
  Filled 2014-11-01 (×11): qty 1

## 2014-11-01 MED ORDER — SODIUM CHLORIDE 0.9 % IJ SOLN
3.0000 mL | Freq: Two times a day (BID) | INTRAMUSCULAR | Status: DC
  Administered 2014-11-01 – 2014-11-06 (×10): 3 mL via INTRAVENOUS

## 2014-11-01 MED ORDER — ONDANSETRON HCL 4 MG/2ML IJ SOLN: 4.0000 mg | Freq: Four times a day (QID) | INTRAMUSCULAR | Status: DC | PRN

## 2014-11-01 MED ORDER — HYDROMORPHONE HCL 1 MG/ML IJ SOLN: 0.5000 mg | INTRAMUSCULAR | Status: DC | PRN

## 2014-11-01 MED ORDER — LEVOTHYROXINE SODIUM 75 MCG PO TABS
75.0000 ug | ORAL_TABLET | Freq: Every day | ORAL | Status: DC
  Administered 2014-11-01 – 2014-11-06 (×6): 75 ug via ORAL
  Filled 2014-11-01 (×6): qty 1

## 2014-11-01 MED ORDER — ASPIRIN EC 81 MG PO TBEC
81.0000 mg | DELAYED_RELEASE_TABLET | Freq: Every day | ORAL | Status: DC
  Administered 2014-11-01 – 2014-11-06 (×6): 81 mg via ORAL
  Filled 2014-11-01 (×7): qty 1

## 2014-11-01 MED ORDER — SODIUM CHLORIDE 0.9 % IJ SOLN: 3.0000 mL | INTRAMUSCULAR | Status: DC | PRN

## 2014-11-01 NOTE — ED Notes (Signed)
PT sent from PCP office d/t flare up of cellulitis to right lower leg since yesterday with recent hospitalization for same.

## 2014-11-01 NOTE — ED Notes (Signed)
MD at bedside. 

## 2014-11-01 NOTE — ED Notes (Signed)
Pt sent over by Dr. Janna Archondiego. Pt was recently admitted for cellulitis. Pt states flare up returned yesterday to [facility].

## 2014-11-01 NOTE — H&P (Signed)
[facility] History and Physical  Angela [name] Mcmichen ZOX:[id number] DOB: [birth year] DOA: 11/01/2014  Referring physician:  PCP: Isabella StallingNDIEGO,RICHARD M, MD   Chief Complaint: LE edema  HPI: Angela [name] Whinery is a 80 y.o. female with a past medical history that includes diabetes, hypertension, CAD as well as recent hospitalization for right lower extremity cellulitis presents to the emergency department with the chief complaint of lower extremity edema. Initial evaluation reveals recurrent right lower extremity cellulitis on exam. Patient reports being discharged about 2-1/2 weeks ago from a 4 day hospitalization for cellulitis. She reports having completed her antibiotics as directed. She reports that the initial cellulitis did clear up there was no redness or swelling of her lower extremity. She states over the last couple of days she's noticed a gradual "pink" shade to her right lower extremity. Yesterday there was increased swelling and last night she developed pain. She reports the pain an 8 out of 10 constant worse with weightbearing. She denies fever chills nausea vomiting. She denies headache visual disturbances. She denies numbness or tingling of her lower extremities.  Workup in the urgency department reveals a sodium level CXXXVI serum glucose 243 proBNP 772 WBCs 13.1. She is afebrile and hemodynamically stable she is not hypoxic. In the emergency department she is provided with vancomycin.    Review of Systems:  10 point review of systems completed and all systems are negative except as indicated in the history of present illness     Past Medical History  Diagnosis Date  . GERD (gastroesophageal reflux disease)   . Family hx of colon cancer   . Sphincter of Oddi spasm     Status post ERCP with sphincterotomy for SOD versus papillary stenosis  . Diverticulosis of colon   . Helicobacter pylori gastritis     s/p treatment?  . Gastroparesis 2005    Abnormal GS  . Heart disease,  unspecified   . Osteoarthritis   . IBS (irritable bowel syndrome)   . Osteopenia   . Goiter     Dr Emmit Pomfretarol Wolicki  . Hypothyroid   . COPD (chronic obstructive pulmonary disease)   . Asthma   . Hypercholesterolemia   . HTN (hypertension)   . DM (diabetes mellitus)   . Fatty liver     Insetting of normal LFTs  . History of adenomatous polyp of colon   . Renal artery stenosis 07/06/12    R renal artery 60-99% diameter reduction PSV 336, EBV 107; >60% narrowing in celiac SMA and IMA  . Pulmonary nodules     Multiple, had previously been followed by CT, stable  . Chest pain     echo 01/29/11 - mild dilation of LA, mild diastolic dysfunction; cath 05/2007 - non-obstructive CAD 10% in LAD , 20-30% in RCA  . Dyspnea 07/30/12    stress test - normal perfusion all regions, EF 67%, no sig change from last study 2012  . Lower extremity edema 01/07/11    doppler -no evidence of thrombus or insufficiency   Past Surgical History  Procedure Laterality Date  . Tubal ligation    . Partial hysterectomy    . Cholecystectomy    . Abdominal hernia repair      x 3  . Hip surgery      left  . Ercp w/ sphincterotomy and balloon dilation    . Colonoscopy  05/29/2008    Dr. Jena Gaussourk-> diverticulosis, hemorrhoids, 2 hyperplastic polyps  . Cardiac catheterization  05/28/07    normal LV  fcn, 30% narrowing in osteum of RCA, 40% narrowig in ostium of LRA   Social History:  reports that she has never smoked. She has never used smokeless tobacco. She reports that she does not drink alcohol or use illicit drugs. She lives alone she ambulates without assistance she has family members who live nearby she is fairly independent with ADLs  Allergies  Allergen Reactions  . Statins Other (See Comments)    myalgias  . Esomeprazole Magnesium     REACTION: rash  . Morphine     REACTION: UNKNOWN REACTION    Family History  Problem Relation Age of Onset  . Colon cancer Brother 76  . Lung cancer Brother   . Cancer  Sister     bladder  . Lung cancer Father      Prior to Admission medications   Medication Sig Start Date End Date Taking? Authorizing Provider  albuterol (PROVENTIL HFA;VENTOLIN HFA) 108 (90 BASE) MCG/ACT inhaler Inhale 2 puffs into the lungs every 6 (six) hours as needed for wheezing or shortness of breath.    Yes Historical Provider, MD  aspirin 81 MG tablet Take 81 mg by mouth daily.   Yes Historical Provider, MD  cholecalciferol (VITAMIN D) 1000 UNITS tablet Take 2,000 Units by mouth daily.   Yes Historical Provider, MD  furosemide (LASIX) 40 MG tablet Take 1 tablet (40 mg total) by mouth daily as needed for fluid. 10/16/14  Yes Richard Leotis Shames, MD  glipiZIDE (GLUCOTROL) 5 MG tablet Take 5 mg by mouth 2 (two) times daily before a meal.   Yes Historical Provider, MD  ibuprofen (ADVIL,MOTRIN) 800 MG tablet Take 800 mg by mouth every 8 (eight) hours as needed for moderate pain.    Yes Historical Provider, MD  isosorbide mononitrate (IMDUR) 30 MG 24 hr tablet TAKE ONE TABLET TWICE DAILY 05/21/13  Yes Lennette Bihari, MD  lansoprazole (PREVACID) 30 MG capsule Take 1 capsule (30 mg total) by mouth daily. 10/16/11  Yes Joselyn Arrow, NP  levothyroxine (SYNTHROID, LEVOTHROID) 75 MCG tablet Take 75 mcg by mouth daily.     Yes Historical Provider, MD  lisinopril (PRINIVIL,ZESTRIL) 40 MG tablet Take 1 tablet (40 mg total) by mouth daily. 03/17/14  Yes Antoine Poche, MD  metoprolol succinate (TOPROL-XL) 100 MG 24 hr tablet TAKE ONE MG IN THE AM AND 5OMG IN THE PM Patient taking differently: Take 50-100 mg by mouth 2 (two) times daily. TAKE 100 MG IN THE AM AND 50 MG IN THE PM 01/09/14  Yes Jodelle Gross, NP  NITROSTAT 0.4 MG SL tablet Place 0.4 mg under the tongue every 5 (five) minutes as needed for chest pain.  10/03/11  Yes Historical Provider, MD  potassium chloride SA (K-DUR,KLOR-CON) 20 MEQ tablet Take 1 tablet (20 mEq total) by mouth 2 (two) times daily. 10/16/14  Yes Isabella Stalling,  MD  tolterodine (DETROL LA) 4 MG 24 hr capsule Take 4 mg by mouth daily.     Yes Historical Provider, MD  traMADol (ULTRAM) 50 MG tablet Take 50 mg by mouth every 6 (six) hours as needed for moderate pain. Maximum dose= 8 tablets per day   Yes Historical Provider, MD  cephALEXin (KEFLEX) 500 MG capsule Take 1 capsule (500 mg total) by mouth every 6 (six) hours. Patient not taking: Reported on 11/01/2014 10/16/14   Isabella Stalling, MD  doxycycline (VIBRA-TABS) 100 MG tablet Take 1 tablet (100 mg total) by mouth 2 (two) times daily.  Patient not taking: Reported on 11/01/2014 10/16/14   Isabella Stallingichard M Dondiego, MD  ipratropium-albuterol (DUONEB) 0.5-2.5 (3) MG/3ML SOLN Take 3 mLs by nebulization every 6 (six) hours as needed (WHEEZING/SHORTNESS OF BREATH).     Historical Provider, MD   Physical Exam: Filed Vitals:   11/01/14 1130 11/01/14 1200 11/01/14 1235 11/01/14 1312  BP: 126/63 122/45 132/65 128/73  Pulse: 89 80 79 87  Temp:    99.1 F (37.3 [name])  TempSrc:    Oral  Resp: 16 21 20 20   Height:    5\' 1"  (1.549 m)  Weight:    84.868 kg (187 lb 1.6 oz)  SpO2: 99% 97% 96% 96%    Wt Readings from Last 3 Encounters:  11/01/14 84.868 kg (187 lb 1.6 oz)  10/12/14 86.183 kg (190 lb)  07/27/14 86.183 kg (190 lb)    General:  Appears calm and comfortable Eyes: PERRL, normal lids, irises & conjunctiva ENT: grossly normal hearing, lips & tongue Neck: no LAD, masses or thyromegaly Cardiovascular: RRR, no m/r/g. Bilateral lower extremity edema right slightly greater than left  Respiratory: CTA bilaterally, no w/r/r. Normal respiratory effort. Abdomen: soft, ntnd positive bowel sounds throughout  Skin: Pink rash on right lower extremity from ankle up to her knee. No real induration no drainage warm to touch slightly tender  Musculoskeletal: grossly normal tone BUE/BLE Psychiatric: grossly normal mood and affect, speech fluent and appropriate Neurologic: grossly non-focal.          Labs on  Admission:  Basic Metabolic Panel:  Recent Labs Lab 11/01/14 1116  NA 136*  K 4.3  CL 95*  CO2 30  GLUCOSE 243*  BUN 17  CREATININE 0.79  CALCIUM 9.5   Liver Function Tests:  Recent Labs Lab 11/01/14 1116  AST 13  ALT 11  ALKPHOS 57  BILITOT 0.7  PROT 7.2  ALBUMIN 4.0   No results for input(s): LIPASE, AMYLASE in the last 168 hours. No results for input(s): AMMONIA in the last 168 hours. CBC:  Recent Labs Lab 11/01/14 1116  WBC 13.1*  NEUTROABS 11.4*  HGB 13.4  HCT 39.2  MCV 86.2  PLT 189   Cardiac Enzymes: No results for input(s): CKTOTAL, CKMB, CKMBINDEX, TROPONINI in the last 168 hours.  BNP (last 3 results)  Recent Labs  11/01/14 1116  PROBNP 772.9*   CBG: No results for input(s): GLUCAP in the last 168 hours.  Radiological Exams on Admission: No results found.  EKG:   Assessment/Plan Principal Problem:   Cellulitis; recurrent. Will admit to the medical floor. Will continue vancomycin that was initiated in the emergency department. Blood cultures drawn in the emergency department we'll follow. She is afebrile hemodynamically stable and not toxic appearing. Monitor  Active Problems:    Hypothyroidism: Continue home medications    Diabetes: Serum glucose 243. He is on glipizide and I will continue. Will provide sliding scale for optimal coverage. A1c done last month was 8.4    HYPERCHOLESTEROLEMIA: Continue home medications    GERD: Stable at baseline  Hypertension: Controlled. Continue home medications  COPD: Stable at baseline. Continue home meds and inhalers.    none Code Status: full DVT Prophylaxis: Family Communication: granddaughter at bedside Disposition Plan: home when ready  Time spent: 65 minutes  [facility] CeBLACK,KAREN M [facility] Pager [phone]  .TRH  Patient seen and evaluated and agree with above plan

## 2014-11-01 NOTE — Progress Notes (Signed)
ANTIBIOTIC CONSULT NOTE - INITIAL  Pharmacy Consult for Vancomycin Indication: cellulitis  Allergies  Allergen Reactions  . Statins Other (See Comments)    myalgias  . Esomeprazole Magnesium     REACTION: rash  . Morphine     REACTION: UNKNOWN REACTION   Patient Measurements: Height: 5\' 1"  (154.9 cm) Weight: 187 lb 1.6 oz (84.868 kg) IBW/kg (Calculated) : 47.8  Vital Signs: Temp: 99.1 F (37.3 C) (12/16 1312) Temp Source: Oral (12/16 1312) BP: 128/73 mmHg (12/16 1312) Pulse Rate: 87 (12/16 1312) Intake/Output from previous day:   Intake/Output from this shift:    Labs:  Recent Labs  11/01/14 1116  WBC 13.1*  HGB 13.4  PLT 189  CREATININE 0.79   Estimated Creatinine Clearance: 56.4 mL/min (by C-G formula based on Cr of 0.79). No results for input(s): VANCOTROUGH, VANCOPEAK, VANCORANDOM, GENTTROUGH, GENTPEAK, GENTRANDOM, TOBRATROUGH, TOBRAPEAK, TOBRARND, AMIKACINPEAK, AMIKACINTROU, AMIKACIN in the last 72 hours.   Microbiology: Recent Results (from the past 720 hour(s))  Blood culture (routine x 2)     Status: None   Collection Time: 10/12/14  4:00 AM  Result Value Ref Range Status   Specimen Description BLOOD RIGHT HAND DRAWN BY RN  Final   Special Requests   Final    BOTTLES DRAWN AEROBIC AND ANAEROBIC AEB=8CC ANA=6CC   Culture NO GROWTH 5 DAYS  Final   Report Status 10/17/2014 FINAL  Final  Blood culture (routine x 2)     Status: None   Collection Time: 10/12/14  4:05 AM  Result Value Ref Range Status   Specimen Description BLOOD LEFT ANTECUBITAL  Final   Special Requests BOTTLES DRAWN AEROBIC AND ANAEROBIC 10CC EACH  Final   Culture NO GROWTH 5 DAYS  Final   Report Status 10/17/2014 FINAL  Final   Medical History: Past Medical History  Diagnosis Date  . GERD (gastroesophageal reflux disease)   . Family hx of colon cancer   . Sphincter of Oddi spasm     Status post ERCP with sphincterotomy for SOD versus papillary stenosis  . Diverticulosis of  colon   . Helicobacter pylori gastritis     s/p treatment?  . Gastroparesis 2005    Abnormal GS  . Heart disease, unspecified   . Osteoarthritis   . IBS (irritable bowel syndrome)   . Osteopenia   . Goiter     Dr Emmit Pomfretarol Wolicki  . Hypothyroid   . COPD (chronic obstructive pulmonary disease)   . Asthma   . Hypercholesterolemia   . HTN (hypertension)   . DM (diabetes mellitus)   . Fatty liver     Insetting of normal LFTs  . History of adenomatous polyp of colon   . Renal artery stenosis 07/06/12    R renal artery 60-99% diameter reduction PSV 336, EBV 107; >60% narrowing in celiac SMA and IMA  . Pulmonary nodules     Multiple, had previously been followed by CT, stable  . Chest pain     echo 01/29/11 - mild dilation of LA, mild diastolic dysfunction; cath 05/2007 - non-obstructive CAD 10% in LAD , 20-30% in RCA  . Dyspnea 07/30/12    stress test - normal perfusion all regions, EF 67%, no sig change from last study 2012  . Lower extremity edema 01/07/11    doppler -no evidence of thrombus or insufficiency   Anti-infectives    Start     Dose/Rate Route Frequency Ordered Stop   11/01/14 2100  vancomycin (VANCOCIN) IVPB 1000 mg/200 mL  premix     1,000 mg200 mL/hr over 60 Minutes Intravenous Every 12 hours 11/01/14 1326     11/01/14 1100  vancomycin (VANCOCIN) IVPB 1000 mg/200 mL premix     1,000 mg200 mL/hr over 60 Minutes Intravenous  Once 11/01/14 1055 11/01/14 1230     Assessment: 80yo female with recurrent LE cellulitis.  Asked to initiate Vancomycin, 1st dose given on admission.  Estimated Creatinine Clearance: 56.4 mL/min (by C-G formula based on Cr of 0.79).  Goal of Therapy:  Vancomycin trough level 10-15 mcg/ml  Plan:  Vancomycin 1000mg  IV q12hrs Check trough at steady state Monitor labs, renal fxn, and cultures  Valrie HartHall, Smera Guyette A 11/01/2014,1:27 PM

## 2014-11-01 NOTE — ED Notes (Signed)
Report given to Christina on 3A for room 322. 

## 2014-11-01 NOTE — ED Provider Notes (Signed)
CSN: [id number]     Arrival date & time 11/01/14  1007 History  This chart was scribe for Benny Lennert, MD by Angelene Giovanni, ED Scribe. The patient was seen in room APA18/APA18 and the patient's care was started at 10:49 AM.    Chief Complaint  Patient presents with  . Cellulitis   The history is provided by the patient and a relative (pt c/o cellulitis ). No language interpreter was used.   HPI Comments: Angela [name] is a 80 y.o. female who presents to the Emergency Department complaining of a worsening cellulitis on her right leg onset last night. She reports associated redness to the area. She denies fever and chills. Her family member reports that she was recently hospitalized for the same symptoms by the end of November but was allowed to leave early from the hospital.   PCP: Janna Arch  Past Medical History  Diagnosis Date  . GERD (gastroesophageal reflux disease)   . Family hx of colon cancer   . Sphincter of Oddi spasm     Status post ERCP with sphincterotomy for SOD versus papillary stenosis  . Diverticulosis of colon   . Helicobacter pylori gastritis     s/p treatment?  . Gastroparesis 2005    Abnormal GS  . Heart disease, unspecified   . Osteoarthritis   . IBS (irritable bowel syndrome)   . Osteopenia   . Goiter     Dr Emmit Pomfret  . Hypothyroid   . COPD (chronic obstructive pulmonary disease)   . Asthma   . Hypercholesterolemia   . HTN (hypertension)   . DM (diabetes mellitus)   . Fatty liver     Insetting of normal LFTs  . History of adenomatous polyp of colon   . Renal artery stenosis 07/06/12    R renal artery 60-99% diameter reduction PSV 336, EBV 107; >60% narrowing in celiac SMA and IMA  . Pulmonary nodules     Multiple, had previously been followed by CT, stable  . Chest pain     echo 01/29/11 - mild dilation of LA, mild diastolic dysfunction; cath 05/2007 - non-obstructive CAD 10% in LAD , 20-30% in RCA  . Dyspnea 07/30/12    stress test -  normal perfusion all regions, EF 67%, no sig change from last study 2012  . Lower extremity edema 01/07/11    doppler -no evidence of thrombus or insufficiency   Past Surgical History  Procedure Laterality Date  . Tubal ligation    . Partial hysterectomy    . Cholecystectomy    . Abdominal hernia repair      x 3  . Hip surgery      left  . Ercp w/ sphincterotomy and balloon dilation    . Colonoscopy  05/29/2008    Dr. Jena Gauss diverticulosis, hemorrhoids, 2 hyperplastic polyps  . Cardiac catheterization  05/28/07    normal LV fcn, 30% narrowing in osteum of RCA, 40% narrowig in ostium of LRA   Family History  Problem Relation Age of Onset  . Colon cancer Brother 50  . Lung cancer Brother   . Cancer Sister     bladder  . Lung cancer Father    History  Substance Use Topics  . Smoking status: Never Smoker   . Smokeless tobacco: Never Used  . Alcohol Use: No   OB History    No data available     Review of Systems  Constitutional: Negative for appetite change and fatigue.  HENT:  Negative for congestion, ear discharge and sinus pressure.   Eyes: Negative for discharge.  Respiratory: Negative for cough.   Cardiovascular: Negative for chest pain.  Gastrointestinal: Negative for abdominal pain and diarrhea.  Genitourinary: Negative for frequency and hematuria.  Musculoskeletal: Negative for back pain.  Skin: Positive for rash.  Neurological: Negative for seizures and headaches.  Psychiatric/Behavioral: Negative for hallucinations.      Allergies  Statins; Esomeprazole magnesium; and Morphine  Home Medications   Prior to Admission medications   Medication Sig Start Date End Date Taking? Authorizing Provider  albuterol (PROVENTIL HFA;VENTOLIN HFA) 108 (90 BASE) MCG/ACT inhaler Inhale 2 puffs into the lungs every 6 (six) hours as needed for wheezing or shortness of breath.     Historical Provider, MD  aspirin 81 MG tablet Take 81 mg by mouth daily.    Historical Provider,  MD  cephALEXin (KEFLEX) 500 MG capsule Take 1 capsule (500 mg total) by mouth every 6 (six) hours. 10/16/14   Isabella Stallingichard M Dondiego, MD  cholecalciferol (VITAMIN D) 1000 UNITS tablet Take 2,000 Units by mouth daily.    Historical Provider, MD  doxycycline (VIBRA-TABS) 100 MG tablet Take 1 tablet (100 mg total) by mouth 2 (two) times daily. 10/16/14   Isabella Stallingichard M Dondiego, MD  furosemide (LASIX) 40 MG tablet Take 1 tablet (40 mg total) by mouth daily as needed for fluid. 10/16/14   Isabella Stallingichard M Dondiego, MD  glipiZIDE (GLUCOTROL) 5 MG tablet Take 5 mg by mouth 2 (two) times daily before a meal.    Historical Provider, MD  ibuprofen (ADVIL,MOTRIN) 800 MG tablet Take 800 mg by mouth every 8 (eight) hours as needed for moderate pain.     Historical Provider, MD  ipratropium-albuterol (DUONEB) 0.5-2.5 (3) MG/3ML SOLN Take 3 mLs by nebulization every 6 (six) hours as needed (WHEEZING/SHORTNESS OF BREATH).     Historical Provider, MD  isosorbide mononitrate (IMDUR) 30 MG 24 hr tablet TAKE ONE TABLET TWICE DAILY 05/21/13   Lennette Biharihomas A Kelly, MD  lansoprazole (PREVACID) 30 MG capsule Take 1 capsule (30 mg total) by mouth daily. 10/16/11   Joselyn ArrowKandice L Jones, NP  levothyroxine (SYNTHROID, LEVOTHROID) 75 MCG tablet Take 75 mcg by mouth daily.      Historical Provider, MD  lisinopril (PRINIVIL,ZESTRIL) 40 MG tablet Take 1 tablet (40 mg total) by mouth daily. 03/17/14   Antoine PocheJonathan F Branch, MD  metoprolol succinate (TOPROL-XL) 100 MG 24 hr tablet TAKE ONE MG IN THE AM AND 5OMG IN THE PM Patient taking differently: Take 30-100 mg by mouth 2 (two) times daily. TAKE 100 MG IN THE AM AND 50 MG IN THE PM 01/09/14   Jodelle GrossKathryn M Lawrence, NP  NITROSTAT 0.4 MG SL tablet Place 0.4 mg under the tongue every 5 (five) minutes as needed for chest pain.  10/03/11   Historical Provider, MD  potassium chloride SA (K-DUR,KLOR-CON) 20 MEQ tablet Take 1 tablet (20 mEq total) by mouth 2 (two) times daily. 10/16/14   Isabella Stallingichard M Dondiego, MD  tolterodine  (DETROL LA) 4 MG 24 hr capsule Take 4 mg by mouth daily.      Historical Provider, MD  traMADol (ULTRAM) 50 MG tablet Take 50 mg by mouth every 6 (six) hours as needed for moderate pain. Maximum dose= 8 tablets per day    Historical Provider, MD   BP 131/55 mmHg  Pulse 94  Temp(Src) 98.9 F (37.2 C) (Oral)  Resp 16  SpO2 100% Physical Exam  Constitutional: She is oriented to  person, place, and time. She appears well-developed.  HENT:  Head: Normocephalic.  Eyes: Conjunctivae and EOM are normal. No scleral icterus.  Neck: Neck supple. No thyromegaly present.  Cardiovascular: Normal rate and regular rhythm.  Exam reveals no gallop and no friction rub.   No murmur heard. Pulmonary/Chest: No stridor. She has no wheezes. She has no rales. She exhibits no tenderness.  Abdominal: She exhibits no distension. There is no tenderness. There is no rebound.  Musculoskeletal: Normal range of motion. She exhibits no edema.  Lymphadenopathy:    She has no cervical adenopathy.  Neurological: She is oriented to person, place, and time. She exhibits normal muscle tone. Coordination normal.  Skin: Rash noted. No erythema.  Rash on right lower leg all the up to her knee, appears to be celluitis  Psychiatric: She has a normal mood and affect. Her behavior is normal.  Nursing note and vitals reviewed.   ED Course  Procedures (including critical care time) DIAGNOSTIC STUDIES: Oxygen Saturation is 100% on RA, normal by my interpretation.    COORDINATION OF CARE: 10:52 AM- Pt advised of plan for treatment and pt agrees.    Labs Review Labs Reviewed - No data to display  Imaging Review No results found.   EKG Interpretation None      MDM   Final diagnoses:  None   Cellulitis admit  I personally performed the services described in this documentation, which was scribed in my presence. The recorded information has been reviewed and is accurate.    Benny LennertJoseph L Toshiro Hanken, MD 11/01/14 [phone]

## 2014-11-02 LAB — BASIC METABOLIC PANEL
Anion gap: 9 (ref 5–15)
BUN: 13 mg/dL (ref 6–23)
CHLORIDE: 101 meq/L (ref 96–112)
CO2: 29 mEq/L (ref 19–32)
Calcium: 8.7 mg/dL (ref 8.4–10.5)
Creatinine, Ser: 0.8 mg/dL (ref 0.50–1.10)
GFR calc non Af Amer: 68 mL/min — ABNORMAL LOW (ref >90)
GFR, EST AFRICAN AMERICAN: 79 mL/min — AB (ref >90)
GLUCOSE: 103 mg/dL — AB (ref 70–99)
POTASSIUM: 3.7 meq/L (ref 3.7–5.3)
SODIUM: 139 meq/L (ref 137–147)

## 2014-11-02 LAB — GLUCOSE, CAPILLARY
GLUCOSE-CAPILLARY: 175 mg/dL — AB (ref 70–99)
GLUCOSE-CAPILLARY: 225 mg/dL — AB (ref 70–99)
Glucose-Capillary: 107 mg/dL — ABNORMAL HIGH (ref 70–99)
Glucose-Capillary: 145 mg/dL — ABNORMAL HIGH (ref 70–99)

## 2014-11-02 LAB — CBC
HEMATOCRIT: 32 % — AB (ref 36.0–46.0)
HEMOGLOBIN: 11.1 g/dL — AB (ref 12.0–15.0)
MCH: 29.7 pg (ref 26.0–34.0)
MCHC: 34.4 g/dL (ref 30.0–36.0)
MCV: 86.5 fL (ref 78.0–100.0)
Platelets: 156 10*3/uL (ref 150–400)
RBC: 3.7 MIL/uL — ABNORMAL LOW (ref 3.87–5.11)
RDW: 13.2 % (ref 11.5–15.5)
WBC: 7.1 10*3/uL (ref 4.0–10.5)

## 2014-11-02 LAB — TSH: TSH: 1.62 u[IU]/mL (ref 0.350–4.500)

## 2014-11-02 NOTE — Progress Notes (Signed)
Patient alert on leg less pronounced erythema than yesterday on vancomycin Marius DitchMaggie C Fronczak NWG:[id number] DOB: Jun 02, 1933 DOA: 11/01/2014 PCP: Isabella StallingNDIEGO,Angela [name] M, MD             Physical Exam: Blood pressure 104/52, pulse 96, temperature 99 F (37.2 C), temperature source Oral, resp. rate 17, height 5\' 1"  (1.549 m), weight 186 lb 14.4 oz (84.777 kg), SpO2 98 %. lungs clear to A&P no rales wheeze rhonchi heart regular no S3 or S4 no heaves rubs right leg suffused was negative for DVT 2 weeks ago diffuse erythema some rubor assessment ostomy yesterday   Investigations:  Recent Results (from the past 240 hour(s))  Blood culture (routine x 2)     Status: None (Preliminary result)   Collection Time: 11/01/14 11:16 AM  Result Value Ref Range Status   Specimen Description BLOOD LEFT ANTECUBITAL  Final   Special Requests BOTTLES DRAWN AEROBIC AND ANAEROBIC 5CC  Final   Culture NO GROWTH 1 DAY  Final   Report Status PENDING  Incomplete  Blood culture (routine x 2)     Status: None (Preliminary result)   Collection Time: 11/01/14 11:16 AM  Result Value Ref Range Status   Specimen Description BLOOD RIGHT ANTECUBITAL DRAWN BY RN GM  Final   Special Requests BOTTLES DRAWN AEROBIC AND ANAEROBIC 6CC  Final   Culture NO GROWTH 1 DAY  Final   Report Status PENDING  Incomplete     Basic Metabolic Panel:  Recent Labs  57/84/6912/16/15 1116 11/02/14 0557  NA 136* 139  K 4.3 3.7  CL 95* 101  CO2 30 29  GLUCOSE 243* 103*  BUN 17 13  CREATININE 0.79 0.80  CALCIUM 9.5 8.7   Liver Function Tests:  Recent Labs  11/01/14 1116  AST 13  ALT 11  ALKPHOS 57  BILITOT 0.7  PROT 7.2  ALBUMIN 4.0     CBC:  Recent Labs  11/01/14 1116 11/02/14 0557  WBC 13.1* 7.1  NEUTROABS 11.4*  --   HGB 13.4 11.1*  HCT 39.2 32.0*  MCV 86.2 86.5  PLT 189 156    [place] Venous Img Lower Unilateral Right  11/01/2014   CLINICAL DATA:  Right calf and leg swelling since yesterday. Calf redness and  swelling, possible infection. Pain. History of diabetes, shoulder basal cell neoplasm.  EXAM: RIGHT LOWER EXTREMITY VENOUS DOPPLER ULTRASOUND  TECHNIQUE: Gray-scale sonography with compression, as well as color and duplex ultrasound, were performed to evaluate the deep venous system from the level of the common femoral vein through the popliteal and proximal calf veins.  COMPARISON:  None  FINDINGS: Normal compressibility of the common femoral, superficial femoral, and popliteal veins, as well as the proximal calf veins. No filling defects to suggest DVT on grayscale or color Doppler imaging. Doppler waveforms show normal direction of venous flow, normal respiratory phasicity and response to augmentation. Prominent right inguinal lymph node, 7 mm short axis diameter. Subcutaneous edema in the calf. Visualized segments of the saphenous venous system normal in caliber and compressibility. Survey views of the contralateral common femoral vein are unremarkable.  IMPRESSION: 1. No evidence of lower extremity deep vein thrombosis, RIGHT.   Electronically Signed   By: Angela Balmaniel  [name] M.D.   On: 11/01/2014 16:14      Medications:   Impression:  Principal Problem:   Cellulitis Active Problems:   Hypothyroidism   Diabetes   HYPERCHOLESTEROLEMIA   GERD     Plan: Continue vancomycin await blood cultures monitor for signs  of septicemia although none present   Consultants:    Procedures   Antibiotics: IV vancomycin                  Code Status: Full  Family Communication:    Disposition Plan continue IV vancomycin wound for signs of systemic infection and ambulate patient  Time spent: 30 minutes   LOS: 1 day   Angela [name] M   11/02/2014, 1:11 PM

## 2014-11-02 NOTE — Progress Notes (Signed)
UR chart review completed.  

## 2014-11-02 NOTE — Care Management Note (Addendum)
    Page 1 of 2   11/06/2014     1:11:27 PM CARE MANAGEMENT NOTE 11/06/2014  Patient:  Angela [name],Victory C   Account Number:  [id number]  Date Initiated:  11/02/2014  Documentation initiated by:  Sharrie RothmanBLACKWELL,Donisha Hoch C  Subjective/Objective Assessment:   Pt admitted from home with cellulitis. Pt lives alone and will return home at discharge. Pt children who are very active in the care of the pt. Pt stated that she is independent with ADl's. Pt has a cane and walker for home use.     Action/Plan:   No Cm needs noted.   Anticipated DC Date:  11/06/2014   Anticipated DC Plan:  HOME/SELF CARE      DC Planning Services  CM consult      [facility]   Choice offered to / List presented to:  C-1 Patient        HH arranged  HH-1 RN      [facility]   Status of service:  Completed, signed off Medicare Important Message given?  YES (If response is "NO", the following Medicare IM given date fields will be blank) Date Medicare IM given:  11/03/2014 Medicare IM given by:  Arlyss QueenBLACKWELL,Yohanna Tow C Date Additional Medicare IM given:  11/06/2014 Additional Medicare IM given by:  Sharrie RothmanAMMY C Naitik Hermann  [facility]  Per UR Regulation:    If discussed at Long Length of Stay Meetings, dates discussed:    Comments:  11/06/14 1310 Arlyss Queenammy Eleonora Peeler, RN BSN CM Pt [facility] home today [facility] Alroy BailiffLinda Lothian of [facility] is aware and will collect the pts information from the chart. HH services to start within 24 hours of discharge. Pt having PICC placed prior to discharge. No DME needs noted. Pt and pts nurse aware of discharge arrangements.  11/03/14 1045 Arlyss Queenammy Zyhir Cappella, RN BSN CM Pt still requiring IV AB for cellulitis. If pt d/c over weekend and MD orders HH, pt would like [facility]. Please call and fax orders to [phone] and call [phone] to arrange. Pt and pts nurse aware of discharge  arrangements.  11/02/14 1110 Arlyss Queenammy Rindy Kollman, RN BSN CM

## 2014-11-03 LAB — CBC WITH DIFFERENTIAL/PLATELET
BASOS ABS: 0 10*3/uL (ref 0.0–0.1)
Basophils Relative: 0 % (ref 0–1)
Eosinophils Absolute: 0.1 10*3/uL (ref 0.0–0.7)
Eosinophils Relative: 2 % (ref 0–5)
HCT: 33.1 % — ABNORMAL LOW (ref 36.0–46.0)
Hemoglobin: 11.1 g/dL — ABNORMAL LOW (ref 12.0–15.0)
LYMPHS PCT: 21 % (ref 12–46)
Lymphs Abs: 1 10*3/uL (ref 0.7–4.0)
MCH: 29.3 pg (ref 26.0–34.0)
MCHC: 33.5 g/dL (ref 30.0–36.0)
MCV: 87.3 fL (ref 78.0–100.0)
Monocytes Absolute: 0.5 10*3/uL (ref 0.1–1.0)
Monocytes Relative: 10 % (ref 3–12)
NEUTROS PCT: 67 % (ref 43–77)
Neutro Abs: 3.2 10*3/uL (ref 1.7–7.7)
PLATELETS: 143 10*3/uL — AB (ref 150–400)
RBC: 3.79 MIL/uL — AB (ref 3.87–5.11)
RDW: 13.2 % (ref 11.5–15.5)
WBC: 4.8 10*3/uL (ref 4.0–10.5)

## 2014-11-03 LAB — URINALYSIS, ROUTINE W REFLEX MICROSCOPIC
Bilirubin Urine: NEGATIVE
Glucose, UA: 100 mg/dL — AB
Ketones, ur: NEGATIVE mg/dL
Nitrite: NEGATIVE
PH: 5.5 (ref 5.0–8.0)
Specific Gravity, Urine: 1.02 (ref 1.005–1.030)
Urobilinogen, UA: 0.2 mg/dL (ref 0.0–1.0)

## 2014-11-03 LAB — BASIC METABOLIC PANEL
ANION GAP: 12 (ref 5–15)
BUN: 13 mg/dL (ref 6–23)
CO2: 27 mEq/L (ref 19–32)
Calcium: 8.9 mg/dL (ref 8.4–10.5)
Chloride: 102 mEq/L (ref 96–112)
Creatinine, Ser: 0.68 mg/dL (ref 0.50–1.10)
GFR, EST NON AFRICAN AMERICAN: 81 mL/min — AB (ref >90)
GLUCOSE: 145 mg/dL — AB (ref 70–99)
POTASSIUM: 3.5 meq/L — AB (ref 3.7–5.3)
Sodium: 141 mEq/L (ref 137–147)

## 2014-11-03 LAB — GLUCOSE, CAPILLARY
GLUCOSE-CAPILLARY: 133 mg/dL — AB (ref 70–99)
Glucose-Capillary: 244 mg/dL — ABNORMAL HIGH (ref 70–99)
Glucose-Capillary: 192 mg/dL — ABNORMAL HIGH (ref 70–99)
Glucose-Capillary: 153 mg/dL — ABNORMAL HIGH (ref 70–99)

## 2014-11-03 LAB — URINE MICROSCOPIC-ADD ON

## 2014-11-03 LAB — VANCOMYCIN, TROUGH: VANCOMYCIN TR: 13.1 ug/mL (ref 10.0–20.0)

## 2014-11-03 MED ORDER — IBUPROFEN 800 MG PO TABS: 800.0000 mg | ORAL_TABLET | Freq: Three times a day (TID) | ORAL | Status: DC | PRN

## 2014-11-03 NOTE — Progress Notes (Signed)
ANTIBIOTIC CONSULT NOTE - follow up  Pharmacy Consult for Vancomycin Indication: cellulitis  Allergies  Allergen Reactions  . Statins Other (See Comments)    myalgias  . Esomeprazole Magnesium     REACTION: rash  . Morphine     REACTION: UNKNOWN REACTION   Patient Measurements: Height: 5\' 1"  (154.9 cm) Weight: 185 lb (83.915 kg) IBW/kg (Calculated) : 47.8  Vital Signs: Temp: 98.3 F (36.8 C) (12/18 0326) Temp Source: Oral (12/18 0326) BP: 128/54 mmHg (12/18 0326) Pulse Rate: 82 (12/18 0326) Intake/Output from previous day: 12/17 0701 - 12/18 0700 In: 1120 [P.O.:720; IV Piggyback:400] Out: 400 [Urine:400] Intake/Output from this shift:    Labs:  Recent Labs  11/01/14 1116 11/02/14 0557 11/03/14 0803  WBC 13.1* 7.1 4.8  HGB 13.4 11.1* 11.1*  PLT 189 156 143*  CREATININE 0.79 0.80 0.68   Estimated Creatinine Clearance: 56 mL/min (by C-G formula based on Cr of 0.68).  Recent Labs  11/03/14 0804  VANCOTROUGH 13.1    Microbiology: Recent Results (from the past 720 hour(s))  Blood culture (routine x 2)     Status: None   Collection Time: 10/12/14  4:00 AM  Result Value Ref Range Status   Specimen Description BLOOD RIGHT HAND DRAWN BY RN  Final   Special Requests   Final    BOTTLES DRAWN AEROBIC AND ANAEROBIC AEB=8CC ANA=6CC   Culture NO GROWTH 5 DAYS  Final   Report Status 10/17/2014 FINAL  Final  Blood culture (routine x 2)     Status: None   Collection Time: 10/12/14  4:05 AM  Result Value Ref Range Status   Specimen Description BLOOD LEFT ANTECUBITAL  Final   Special Requests BOTTLES DRAWN AEROBIC AND ANAEROBIC 10CC EACH  Final   Culture NO GROWTH 5 DAYS  Final   Report Status 10/17/2014 FINAL  Final  Blood culture (routine x 2)     Status: None (Preliminary result)   Collection Time: 11/01/14 11:16 AM  Result Value Ref Range Status   Specimen Description BLOOD LEFT ANTECUBITAL  Final   Special Requests BOTTLES DRAWN AEROBIC AND ANAEROBIC 5CC  Final    Culture NO GROWTH 1 DAY  Final   Report Status PENDING  Incomplete  Blood culture (routine x 2)     Status: None (Preliminary result)   Collection Time: 11/01/14 11:16 AM  Result Value Ref Range Status   Specimen Description BLOOD RIGHT ANTECUBITAL DRAWN BY RN GM  Final   Special Requests BOTTLES DRAWN AEROBIC AND ANAEROBIC 6CC  Final   Culture NO GROWTH 1 DAY  Final   Report Status PENDING  Incomplete   Medical History: Past Medical History  Diagnosis Date  . GERD (gastroesophageal reflux disease)   . Family hx of colon cancer   . Sphincter of Oddi spasm     Status post ERCP with sphincterotomy for SOD versus papillary stenosis  . Diverticulosis of colon   . Helicobacter pylori gastritis     s/p treatment?  . Gastroparesis 2005    Abnormal GS  . Heart disease, unspecified   . Osteoarthritis   . IBS (irritable bowel syndrome)   . Osteopenia   . Goiter     Dr Emmit Pomfretarol Wolicki  . Hypothyroid   . COPD (chronic obstructive pulmonary disease)   . Asthma   . Hypercholesterolemia   . HTN (hypertension)   . DM (diabetes mellitus)   . Fatty liver     Insetting of normal LFTs  . History of  adenomatous polyp of colon   . Renal artery stenosis 07/06/12    R renal artery 60-99% diameter reduction PSV 336, EBV 107; >60% narrowing in celiac SMA and IMA  . Pulmonary nodules     Multiple, had previously been followed by CT, stable  . Chest pain     echo 01/29/11 - mild dilation of LA, mild diastolic dysfunction; cath 05/2007 - non-obstructive CAD 10% in LAD , 20-30% in RCA  . Dyspnea 07/30/12    stress test - normal perfusion all regions, EF 67%, no sig change from last study 2012  . Lower extremity edema 01/07/11    doppler -no evidence of thrombus or insufficiency   Anti-infectives    Start     Dose/Rate Route Frequency Ordered Stop   11/01/14 2100  vancomycin (VANCOCIN) IVPB 1000 mg/200 mL premix     1,000 mg200 mL/hr over 60 Minutes Intravenous Every 12 hours 11/01/14 1326      11/01/14 1100  vancomycin (VANCOCIN) IVPB 1000 mg/200 mL premix     1,000 mg200 mL/hr over 60 Minutes Intravenous  Once 11/01/14 1055 11/01/14 1230     Assessment: 80yo female with recurrent LE cellulitis.  Pt reportedly failed OP Rx.  Blood cx (-) so far.  Trough level is on target. Afebrile.  WBC normalized.  Renal fxn stable.  Estimated Creatinine Clearance: 56 mL/min (by C-G formula based on Cr of 0.68).  Goal of Therapy:  Vancomycin trough level 10-15 mcg/ml  Plan:   Continue Vancomycin 1000mg  IV q12hrs  Check trough level weekly or sooner if indicated  Monitor labs, renal fxn, and cultures  Transition to ORAL antibiotics when appropriate  Valrie HartHall, Khadejah Son A 11/03/2014,10:30 AM

## 2014-11-03 NOTE — Progress Notes (Signed)
Patient on IV vancomycin responding to right lower leg cellulitis which is recurrent Angela [name] YNW:[id number] DOB: [birth year] DOA: 11/01/2014 PCP: Angela [name]             Physical Exam: Blood pressure 128/54, pulse 82, temperature 98.3 F (36.8 C), temperature source Oral, resp. rate 18, height 5\' 1"  (1.549 [name]), weight 185 lb (83.915 kg), SpO2 98 %. neck no JVD no bruits no thyromegaly lungs clear to A&P no rales wheeze rhonchi heart regular rhythm no*for measles rubs abdomen soft nontender bowel sounds normoactive right leg suffused erythematous nontender   Investigations:  Recent Results (from the past 240 hour(s))  Blood culture (routine x 2)     Status: None (Preliminary result)   Collection Time: 11/01/14 11:16 AM  Result Value Ref Range Status   Specimen Description BLOOD LEFT ANTECUBITAL  Final   Special Requests BOTTLES DRAWN AEROBIC AND ANAEROBIC 5CC  Final   Culture NO GROWTH 1 DAY  Final   Report Status PENDING  Incomplete  Blood culture (routine x 2)     Status: None (Preliminary result)   Collection Time: 11/01/14 11:16 AM  Result Value Ref Range Status   Specimen Description BLOOD RIGHT ANTECUBITAL DRAWN BY RN GM  Final   Special Requests BOTTLES DRAWN AEROBIC AND ANAEROBIC 6CC  Final   Culture NO GROWTH 1 DAY  Final   Report Status PENDING  Incomplete     Basic Metabolic Panel:  Recent Labs  65/78/4612/17/15 0557 11/03/14 0803  NA 139 141  K 3.7 3.5*  CL 101 102  CO2 29 27  GLUCOSE 103* 145*  BUN 13 13  CREATININE 0.80 0.68  CALCIUM 8.7 8.9   Liver Function Tests:  Recent Labs  11/01/14 1116  AST 13  ALT 11  ALKPHOS 57  BILITOT 0.7  PROT 7.2  ALBUMIN 4.0     CBC:  Recent Labs  11/01/14 1116 11/02/14 0557 11/03/14 0803  WBC 13.1* 7.1 4.8  NEUTROABS 11.4*  --  3.2  HGB 13.4 11.1* 11.1*  HCT 39.2 32.0* 33.1*  MCV 86.2 86.5 87.3  PLT 189 156 143*    [place] Venous Img Lower Unilateral Right  11/01/2014   CLINICAL DATA:   Right calf and leg swelling since yesterday. Calf redness and swelling, possible infection. Pain. History of diabetes, shoulder basal cell neoplasm.  EXAM: RIGHT LOWER EXTREMITY VENOUS DOPPLER ULTRASOUND  TECHNIQUE: Gray-scale sonography with compression, as well as color and duplex ultrasound, were performed to evaluate the deep venous system from the level of the common femoral vein through the popliteal and proximal calf veins.  COMPARISON:  None  FINDINGS: Normal compressibility of the common femoral, superficial femoral, and popliteal veins, as well as the proximal calf veins. No filling defects to suggest DVT on grayscale or color Doppler imaging. Doppler waveforms show normal direction of venous flow, normal respiratory phasicity and response to augmentation. Prominent right inguinal lymph node, 7 mm short axis diameter. Subcutaneous edema in the calf. Visualized segments of the saphenous venous system normal in caliber and compressibility. Survey views of the contralateral common femoral vein are unremarkable.  IMPRESSION: 1. No evidence of lower extremity deep vein thrombosis, RIGHT.   Electronically Signed   By: Oley Balmaniel  Hassell [name].D.   On: 11/01/2014 16:14      Medications:   Impression:  Principal Problem:   Cellulitis Active Problems:   Hypothyroidism   Diabetes   HYPERCHOLESTEROLEMIA   GERD     Plan: Continue IV  vancomycin monitor clinical response  Consultants:     Procedures   Antibiotics: IV vancomycin                  Code Status: F full  Family Communication:    Disposition Plan   Time spent: 30 minutes   LOS: 2 days   Angela [name]   11/03/2014, 1:24 PM

## 2014-11-04 LAB — BASIC METABOLIC PANEL
Anion gap: 12 (ref 5–15)
BUN: 9 mg/dL (ref 6–23)
CALCIUM: 9.1 mg/dL (ref 8.4–10.5)
CO2: 28 mEq/L (ref 19–32)
CREATININE: 0.63 mg/dL (ref 0.50–1.10)
Chloride: 103 mEq/L (ref 96–112)
GFR, EST NON AFRICAN AMERICAN: 83 mL/min — AB (ref >90)
Glucose, Bld: 120 mg/dL — ABNORMAL HIGH (ref 70–99)
Potassium: 3.7 mEq/L (ref 3.7–5.3)
Sodium: 143 mEq/L (ref 137–147)

## 2014-11-04 LAB — GLUCOSE, CAPILLARY
GLUCOSE-CAPILLARY: 133 mg/dL — AB (ref 70–99)
GLUCOSE-CAPILLARY: 198 mg/dL — AB (ref 70–99)
Glucose-Capillary: 184 mg/dL — ABNORMAL HIGH (ref 70–99)
Glucose-Capillary: 175 mg/dL — ABNORMAL HIGH (ref 70–99)

## 2014-11-04 NOTE — Progress Notes (Signed)
Patient is upset this morning because she is under the assumption she is suppose to be on an antibiotic drip around the clock for 24 hours. I have educated the patient several times on the fact that her antibiotic is dosed by pharmacy and given at scheduled times, but she is still upset. Will reiterate this to the patient and continue to monitor and keep her pain under control.

## 2014-11-04 NOTE — Progress Notes (Signed)
Patient alert sitting in chair Angela [name] ZOX:[id number] DOB: 05/09/1933 DOA: 11/01/2014 PCP: Angela StallingNDIEGO,Angela [name] M, MD             Physical Exam: Blood pressure 129/56, pulse 69, temperature 99.2 F (37.3 C), temperature source Oral, resp. rate 20, height 5\' 1"  (1.549 [name]), weight 184 lb 14.4 oz (83.87 kg), SpO2 97 %. lungs clear to A&P no rales wheeze or rhonchi heart regular rhythm no*for measles rubs abdomen soft nontender bowel sounds normoactive right leg 2 inch less erythema receiving somewhat improving less rubor   Investigations:  Recent Results (from the past 240 hour(s))  Blood culture (routine x 2)     Status: None (Preliminary result)   Collection Time: 11/01/14 11:16 AM  Result Value Ref Range Status   Specimen Description BLOOD LEFT ANTECUBITAL  Final   Special Requests BOTTLES DRAWN AEROBIC AND ANAEROBIC 5CC  Final   Culture NO GROWTH 3 DAYS  Final   Report Status PENDING  Incomplete  Blood culture (routine x 2)     Status: None (Preliminary result)   Collection Time: 11/01/14 11:16 AM  Result Value Ref Range Status   Specimen Description BLOOD RIGHT ANTECUBITAL DRAWN BY RN GM  Final   Special Requests BOTTLES DRAWN AEROBIC AND ANAEROBIC 6CC  Final   Culture NO GROWTH 3 DAYS  Final   Report Status PENDING  Incomplete     Basic Metabolic Panel:  Recent Labs  81/19/1412/18/15 0803 11/04/14 0610  NA 141 143  K 3.5* 3.7  CL 102 103  CO2 27 28  GLUCOSE 145* 120*  BUN 13 9  CREATININE 0.68 0.63  CALCIUM 8.9 9.1   Liver Function Tests:  Recent Labs  11/01/14 1116  AST 13  ALT 11  ALKPHOS 57  BILITOT 0.7  PROT 7.2  ALBUMIN 4.0     CBC:  Recent Labs  11/01/14 1116 11/02/14 0557 11/03/14 0803  WBC 13.1* 7.1 4.8  NEUTROABS 11.4*  --  3.2  HGB 13.4 11.1* 11.1*  HCT 39.2 32.0* 33.1*  MCV 86.2 86.5 87.3  PLT 189 156 143*    No results found.    Medications:   Impression:  Principal Problem:   Cellulitis Active Problems:  Hypothyroidism   Diabetes   HYPERCHOLESTEROLEMIA   GERD     Plan: Continue IV vancomycin   Consultants:    Procedures   Antibiotics: IV vancomycin per pharmacy protocol                  Code Status:  Family Communication:    Disposition Plan to new antibiotics measure clinical response  Time spent: 30 minutes   LOS: 3 days   Login Angela [name]   11/04/2014, 9:20 AM

## 2014-11-05 LAB — CBC WITH DIFFERENTIAL/PLATELET
Basophils Absolute: 0 10*3/uL (ref 0.0–0.1)
Basophils Relative: 1 % (ref 0–1)
Eosinophils Absolute: 0.1 10*3/uL (ref 0.0–0.7)
Eosinophils Relative: 4 % (ref 0–5)
HCT: 31.8 % — ABNORMAL LOW (ref 36.0–46.0)
Hemoglobin: 10.8 g/dL — ABNORMAL LOW (ref 12.0–15.0)
LYMPHS ABS: 0.8 10*3/uL (ref 0.7–4.0)
LYMPHS PCT: 24 % (ref 12–46)
MCH: 29.6 pg (ref 26.0–34.0)
MCHC: 34 g/dL (ref 30.0–36.0)
MCV: 87.1 fL (ref 78.0–100.0)
Monocytes Absolute: 0.5 10*3/uL (ref 0.1–1.0)
Monocytes Relative: 14 % — ABNORMAL HIGH (ref 3–12)
NEUTROS PCT: 57 % (ref 43–77)
Neutro Abs: 2 10*3/uL (ref 1.7–7.7)
PLATELETS: 176 10*3/uL (ref 150–400)
RBC: 3.65 MIL/uL — AB (ref 3.87–5.11)
RDW: 13.1 % (ref 11.5–15.5)
WBC: 3.4 10*3/uL — AB (ref 4.0–10.5)

## 2014-11-05 LAB — BASIC METABOLIC PANEL
ANION GAP: 11 (ref 5–15)
BUN: 12 mg/dL (ref 6–23)
CO2: 29 mEq/L (ref 19–32)
Calcium: 9.1 mg/dL (ref 8.4–10.5)
Chloride: 102 mEq/L (ref 96–112)
Creatinine, Ser: 0.67 mg/dL (ref 0.50–1.10)
GFR calc non Af Amer: 81 mL/min — ABNORMAL LOW (ref >90)
Glucose, Bld: 141 mg/dL — ABNORMAL HIGH (ref 70–99)
POTASSIUM: 3.9 meq/L (ref 3.7–5.3)
SODIUM: 142 meq/L (ref 137–147)

## 2014-11-05 LAB — GLUCOSE, CAPILLARY
GLUCOSE-CAPILLARY: 140 mg/dL — AB (ref 70–99)
Glucose-Capillary: 183 mg/dL — ABNORMAL HIGH (ref 70–99)
Glucose-Capillary: 153 mg/dL — ABNORMAL HIGH (ref 70–99)

## 2014-11-05 NOTE — Progress Notes (Signed)
Less erythema and rubor of the right leg and anxious to go home Marius DitchMaggie C Jurgens [id number] DOB: 20-Nov-1932 DOA: 11/01/2014 PCP: Isabella StallingNDIEGO,Jerl Munyan M, MD             Physical Exam: Blood pressure 128/55, pulse 73, temperature 98.1 F (36.7 C), temperature source Oral, resp. rate 18, height 5\' 1"  (1.549 m), weight 184 lb 11.2 oz (83.779 kg), SpO2 96 %. neck no JVD no carotid bruit no thyromegaly lungs clear to A&P no rales wheeze or rhonchi appreciable heart regular rhythm no S3 or S4   Investigations:  Recent Results (from the past 240 hour(s))  Blood culture (routine x 2)     Status: None (Preliminary result)   Collection Time: 11/01/14 11:16 AM  Result Value Ref Range Status   Specimen Description BLOOD LEFT ANTECUBITAL  Final   Special Requests BOTTLES DRAWN AEROBIC AND ANAEROBIC 5CC  Final   Culture NO GROWTH 4 DAYS  Final   Report Status PENDING  Incomplete  Blood culture (routine x 2)     Status: None (Preliminary result)   Collection Time: 11/01/14 11:16 AM  Result Value Ref Range Status   Specimen Description BLOOD RIGHT ANTECUBITAL DRAWN BY RN GM  Final   Special Requests BOTTLES DRAWN AEROBIC AND ANAEROBIC 6CC  Final   Culture NO GROWTH 4 DAYS  Final   Report Status PENDING  Incomplete     Basic Metabolic Panel:  Recent Labs  56/21/3012/19/15 0610 11/05/14 0616  NA 143 142  K 3.7 3.9  CL 103 102  CO2 28 29  GLUCOSE 120* 141*  BUN 9 12  CREATININE 0.63 0.67  CALCIUM 9.1 9.1   Liver Function Tests: No results for input(s): AST, ALT, ALKPHOS, BILITOT, PROT, ALBUMIN in the last 72 hours.   CBC:  Recent Labs  11/03/14 0803 11/05/14 0616  WBC 4.8 3.4*  NEUTROABS 3.2 2.0  HGB 11.1* 10.8*  HCT 33.1* 31.8*  MCV 87.3 87.1  PLT 143* 176    No results found.    Medications:  Impression:  Principal Problem:   Cellulitis Active Problems:   Hypothyroidism   Diabetes   HYPERCHOLESTEROLEMIA   GERD     Plan: Continue IV vancomycin home health  care to see if vancomycin to be administered at home  Consultants:    Procedures   Antibiotics: Vancomycin IV daily per pharmacy                  Code Status:   Family Communication:   Disposition Plan continue IV antibiotics and consider home health IV parenteral therapy  Time spent: 30 minutes   LOS: 4 days   Loni Delbridge M   11/05/2014, 12:42 PM

## 2014-11-06 ENCOUNTER — Encounter (HOSPITAL_COMMUNITY): Payer: Medicare Other

## 2014-11-06 LAB — GLUCOSE, CAPILLARY
Glucose-Capillary: 132 mg/dL — ABNORMAL HIGH (ref 70–99)
Glucose-Capillary: 182 mg/dL — ABNORMAL HIGH (ref 70–99)

## 2014-11-06 LAB — CULTURE, BLOOD (ROUTINE X 2)
Culture: NO GROWTH
Culture: NO GROWTH

## 2014-11-06 MED ORDER — VANCOMYCIN HCL IN DEXTROSE 750-5 MG/150ML-% IV SOLN
750.0000 mg | Freq: Once | INTRAVENOUS | Status: AC
  Administered 2014-11-06: 750 mg via INTRAVENOUS
  Filled 2014-11-06: qty 150

## 2014-11-06 MED ORDER — SODIUM CHLORIDE 0.9 % IJ SOLN: 10.0000 mL | Freq: Two times a day (BID) | INTRAMUSCULAR | Status: DC

## 2014-11-06 MED ORDER — VANCOMYCIN HCL 10 G IV SOLR
1500.0000 mg | INTRAVENOUS | Status: DC
  Filled 2014-11-06: qty 1500

## 2014-11-06 MED ORDER — SODIUM CHLORIDE 0.9 % IJ SOLN: 10.0000 mL | INTRAMUSCULAR | Status: DC | PRN

## 2014-11-06 MED ORDER — VANCOMYCIN HCL 10 G IV SOLR: 1500.0000 mg | INTRAVENOUS | Status: DC

## 2014-11-06 NOTE — Progress Notes (Signed)
Peripherally Inserted Central Catheter/Midline Placement  The IV Nurse has discussed with the patient and/or persons authorized to consent for the patient, the purpose of this procedure and the potential benefits and risks involved with this procedure.  The benefits include less needle sticks, lab draws from the catheter and patient may be discharged home with the catheter.  Risks include, but not limited to, infection, bleeding, blood clot (thrombus formation), and puncture of an artery; nerve damage and irregular heat beat.  Alternatives to this procedure were also discussed.  PICC/Midline Placement Documentation  PICC / Midline Single Lumen 11/06/14 PICC Right Basilic 43 cm 0 cm (Active)  Indication for Insertion or Continuance of Line Administration of hyperosmolar/irritating solutions (i.e. TPN, Vancomycin, etc.);Home intravenous therapies (PICC only) 11/06/2014  1:56 PM  Exposed Catheter (cm) 0 cm 11/06/2014  1:56 PM  Site Assessment Clean;Dry;Intact 11/06/2014  1:56 PM  Line Status Flushed;Saline locked;Capped (central line);Blood return noted 11/06/2014  1:56 PM  Dressing Type Transparent;Securing device 11/06/2014  1:56 PM  Dressing Status Clean;Dry;Intact;Antimicrobial disc in place 11/06/2014  1:56 PM  Line Care Connections checked and tightened 11/06/2014  1:56 PM  Dressing Change Due 11/13/14 11/06/2014  1:56 PM       Leodis RainsHaynes, Eun Vermeer D 11/06/2014, 1:57 PM

## 2014-11-06 NOTE — Progress Notes (Signed)
ANTIBIOTIC CONSULT NOTE - follow up  Pharmacy Consult for Vancomycin Indication: cellulitis  Allergies  Allergen Reactions  . Statins Other (See Comments)    myalgias  . Esomeprazole Magnesium     REACTION: rash  . Morphine     REACTION: UNKNOWN REACTION   Patient Measurements: Height: 5\' 1"  (154.9 cm) Weight: 182 lb 3.2 oz (82.645 kg) IBW/kg (Calculated) : 47.8  Vital Signs: Temp: 98.6 F (37 C) (12/21 0707) Temp Source: Oral (12/21 0707) BP: 145/61 mmHg (12/21 0707) Pulse Rate: 65 (12/21 0707) Intake/Output from previous day: 12/20 0701 - 12/21 0700 In: 400 [IV Piggyback:400] Out: -  Intake/Output from this shift: Total I/O In: 240 [P.O.:240] Out: -   Labs:  Recent Labs  11/04/14 0610 11/05/14 0616  WBC  --  3.4*  HGB  --  10.8*  PLT  --  176  CREATININE 0.63 0.67   Estimated Creatinine Clearance: 55.5 mL/min (by C-G formula based on Cr of 0.67). No results for input(s): VANCOTROUGH, VANCOPEAK, VANCORANDOM, GENTTROUGH, GENTPEAK, GENTRANDOM, TOBRATROUGH, TOBRAPEAK, TOBRARND, AMIKACINPEAK, AMIKACINTROU, AMIKACIN in the last 72 hours.  Microbiology: Recent Results (from the past 720 hour(s))  Blood culture (routine x 2)     Status: None   Collection Time: 10/12/14  4:00 AM  Result Value Ref Range Status   Specimen Description BLOOD RIGHT HAND DRAWN BY RN  Final   Special Requests   Final    BOTTLES DRAWN AEROBIC AND ANAEROBIC AEB=8CC ANA=6CC   Culture NO GROWTH 5 DAYS  Final   Report Status 10/17/2014 FINAL  Final  Blood culture (routine x 2)     Status: None   Collection Time: 10/12/14  4:05 AM  Result Value Ref Range Status   Specimen Description BLOOD LEFT ANTECUBITAL  Final   Special Requests BOTTLES DRAWN AEROBIC AND ANAEROBIC 10CC EACH  Final   Culture NO GROWTH 5 DAYS  Final   Report Status 10/17/2014 FINAL  Final  Blood culture (routine x 2)     Status: None   Collection Time: 11/01/14 11:16 AM  Result Value Ref Range Status   Specimen  Description BLOOD LEFT ANTECUBITAL  Final   Special Requests BOTTLES DRAWN AEROBIC AND ANAEROBIC 5CC  Final   Culture NO GROWTH 5 DAYS  Final   Report Status 11/06/2014 FINAL  Final  Blood culture (routine x 2)     Status: None   Collection Time: 11/01/14 11:16 AM  Result Value Ref Range Status   Specimen Description BLOOD RIGHT ANTECUBITAL DRAWN BY RN GM  Final   Special Requests BOTTLES DRAWN AEROBIC AND ANAEROBIC 6CC  Final   Culture NO GROWTH 5 DAYS  Final   Report Status 11/06/2014 FINAL  Final   Medical History: Past Medical History  Diagnosis Date  . GERD (gastroesophageal reflux disease)   . Family hx of colon cancer   . Sphincter of Oddi spasm     Status post ERCP with sphincterotomy for SOD versus papillary stenosis  . Diverticulosis of colon   . Helicobacter pylori gastritis     s/p treatment?  . Gastroparesis 2005    Abnormal GS  . Heart disease, unspecified   . Osteoarthritis   . IBS (irritable bowel syndrome)   . Osteopenia   . Goiter     Dr Emmit Pomfretarol Wolicki  . Hypothyroid   . COPD (chronic obstructive pulmonary disease)   . Asthma   . Hypercholesterolemia   . HTN (hypertension)   . DM (diabetes mellitus)   .  Fatty liver     Insetting of normal LFTs  . History of adenomatous polyp of colon   . Renal artery stenosis 07/06/12    R renal artery 60-99% diameter reduction PSV 336, EBV 107; >60% narrowing in celiac SMA and IMA  . Pulmonary nodules     Multiple, had previously been followed by CT, stable  . Chest pain     echo 01/29/11 - mild dilation of LA, mild diastolic dysfunction; cath 05/2007 - non-obstructive CAD 10% in LAD , 20-30% in RCA  . Dyspnea 07/30/12    stress test - normal perfusion all regions, EF 67%, no sig change from last study 2012  . Lower extremity edema 01/07/11    doppler -no evidence of thrombus or insufficiency   Anti-infectives    Start     Dose/Rate Route Frequency Ordered Stop   11/07/14 1000  vancomycin (VANCOCIN) 1,500 mg in sodium  chloride 0.9 % 500 mL IVPB     1,500 mg250 mL/hr over 120 Minutes Intravenous Every 24 hours 11/06/14 1209     11/06/14 1300  vancomycin (VANCOCIN) IVPB 750 mg/150 ml premix     750 mg150 mL/hr over 60 Minutes Intravenous  Once 11/06/14 1209     11/01/14 2100  vancomycin (VANCOCIN) IVPB 1000 mg/200 mL premix  Status:  Discontinued     1,000 mg200 mL/hr over 60 Minutes Intravenous Every 12 hours 11/01/14 1326 11/06/14 1208   11/01/14 1100  vancomycin (VANCOCIN) IVPB 1000 mg/200 mL premix     1,000 mg200 mL/hr over 60 Minutes Intravenous  Once 11/01/14 1055 11/01/14 1230     Assessment: 80yo female with recurrent LE cellulitis.  Pt reportedly failed OP Rx (reportedly due to noncompliance).   Blood cx (-) so far.  Trough level was on target when checked. Afebrile.  WBC normalized.  Renal fxn stable.  Estimated Creatinine Clearance: 55.5 mL/min (by C-G formula based on Cr of 0.67).  Per Dr Janna ArchonDiego, pt will need to go home on IV Vancomycin due to noncompliance and previous treatment failure.  He asked to to change Vancomycin to once daily to simplify regimen and facilitate OP Rx.  Goal of Therapy:  Vancomycin trough level 10-15 mcg/ml  Plan:   Vancomycin 750mg  IV now x 1 (to make 1750mg  total dose today) then  Vancomycin 1500mg  IV q24hrs (anticipate 10 additional days treatment per MD)  Check trough level on Wednesday (12/23 - per home health agency)  Monitor SCr / renal fxn twice weekly for duration of Rx  Valrie HartHall, Sirenity Shew A 11/06/2014,12:09 PM

## 2014-11-06 NOTE — Progress Notes (Signed)
Discharge instructions reviewed with patient and daughter. PICC instructions discussed with patients daughter. [facility] number given to daughter in case they are needed. Patient taken out via wheelchair for discharge home.

## 2014-11-06 NOTE — Progress Notes (Deleted)
First tap water enema given. Patient tolerated well. Brown tinged water return with flecks of stool noted.

## 2014-11-30 NOTE — Discharge Summary (Signed)
Physician Discharge Summary  Angela [name] ZOX:[id number] DOB: October 14, 1933 DOA: 11/01/2014  PCP: Isabella StallingNDIEGO,Leslie Langille M, MD  Admit date: 11/01/2014 Discharge date: 11/30/2014   Recommendations for Outpatient Follow-up:  Patient is discharged home with home health care and IV anterior antibiotics in the form of vancomycin and Zosyn for a period of at least 10 additional days Discharge Diagnoses:  Principal Problem:   Cellulitis Active Problems:   Hypothyroidism   Diabetes   HYPERCHOLESTEROLEMIA   GERD   Discharge Condition: Good  Filed Weights   11/04/14 0626 11/05/14 0528 11/06/14 0707  Weight: 184 lb 14.4 oz (83.87 kg) 184 lb 11.2 oz (83.779 kg) 182 lb 3.2 oz (82.645 kg)    History of present illness:  Patient was admitted with the diagnosis of right lower lobe rapidly ascending cellulitis venous Dopplers of legs revealed no evidence of DVT sugar treated aggressively vancomycin and Zosyn with subsequent mild improvement of 5-7 day. Felt she was able to be discharged with continued IV antibiotics as per home health care and follow-up in the office in 4 days time  Hospital Course:  Patient with right lower extremity cellulitis was treated aggressively with IV vancomycin and Zosyn with partial resolution was felt best of the surgical consultation to continue her on the same antibiotics intravenously for an additional 10 days at home which was prescribed  Procedures:  Venous Doppler negative for DVT  Consultations:  Surgery  Discharge Instructions  Discharge Instructions    Discharge instructions    Complete by:  As directed      Discharge instructions    Complete by:  As directed      Face-to-face encounter (required for Medicare/Medicaid patients)    Complete by:  As directed   I Angela [name] M certify that this patient is under my care and that I, or a nurse practitioner or physician's assistant working with me, had a face-to-face encounter that meets the  physician face-to-face encounter requirements with this patient on 11/06/2014. The encounter with the patient was in whole, or in part for the following medical condition(s) which is the primary reason for home health care (List medical condition): recurrent lower extrremity  Cellulitis  The encounter with the patient was in whole, or in part, for the following medical condition, which is the primary reason for home health care:  recurrent cellulitis  I certify that, based on my findings, the following services are medically necessary home health services:  Nursing  Reason for Medically Necessary Home Health Services:  Skilled Nursing- Changes in Medication/Medication Management  My clinical findings support the need for the above services:  Unable to leave home safely without assistance and/or assistive device  Further, I certify that my clinical findings support that this patient is homebound due to:  Ambulates short distances less than 300 feet     Home Health    Complete by:  As directed   To provide the following care/treatments:  RN  Vancomycin IV 1,500 mg daily for ten days            Medication List    STOP taking these medications        cephALEXin 500 MG capsule  Commonly known as:  KEFLEX     doxycycline 100 MG tablet  Commonly known as:  VIBRA-TABS     ibuprofen 800 MG tablet  Commonly known as:  ADVIL,MOTRIN      TAKE these medications        albuterol 108 (90 BASE) MCG/ACT  inhaler  Commonly known as:  PROVENTIL HFA;VENTOLIN HFA  Inhale 2 puffs into the lungs every 6 (six) hours as needed for wheezing or shortness of breath.     aspirin 81 MG tablet  Take 81 mg by mouth daily.     cholecalciferol 1000 UNITS tablet  Commonly known as:  VITAMIN D  Take 2,000 Units by mouth daily.     furosemide 40 MG tablet  Commonly known as:  LASIX  Take 1 tablet (40 mg total) by mouth daily as needed for fluid.     glipiZIDE 5 MG tablet  Commonly known as:  GLUCOTROL    Take 5 mg by mouth 2 (two) times daily before a meal.     ipratropium-albuterol 0.5-2.5 (3) MG/3ML Soln  Commonly known as:  DUONEB  Take 3 mLs by nebulization every 6 (six) hours as needed (WHEEZING/SHORTNESS OF BREATH).     isosorbide mononitrate 30 MG 24 hr tablet  Commonly known as:  IMDUR  TAKE ONE TABLET TWICE DAILY     lansoprazole 30 MG capsule  Commonly known as:  PREVACID  Take 1 capsule (30 mg total) by mouth daily.     levothyroxine 75 MCG tablet  Commonly known as:  SYNTHROID, LEVOTHROID  Take 75 mcg by mouth daily.     lisinopril 40 MG tablet  Commonly known as:  PRINIVIL,ZESTRIL  Take 1 tablet (40 mg total) by mouth daily.     metoprolol succinate 100 MG 24 hr tablet  Commonly known as:  TOPROL-XL  TAKE ONE MG IN THE AM AND 5OMG IN THE PM     NITROSTAT 0.4 MG SL tablet  Generic drug:  nitroGLYCERIN  Place 0.4 mg under the tongue every 5 (five) minutes as needed for chest pain.     potassium chloride SA 20 MEQ tablet  Commonly known as:  K-DUR,KLOR-CON  Take 1 tablet (20 mEq total) by mouth 2 (two) times daily.     tolterodine 4 MG 24 hr capsule  Commonly known as:  DETROL LA  Take 4 mg by mouth daily.     traMADol 50 MG tablet  Commonly known as:  ULTRAM  Take 50 mg by mouth every 6 (six) hours as needed for moderate pain. Maximum dose= 8 tablets per day     vancomycin 1,500 mg in sodium chloride 0.9 % 500 mL  Inject 1,500 mg into the vein daily.       Allergies  Allergen Reactions  . Statins Other (See Comments)    myalgias  . Esomeprazole Magnesium     REACTION: rash  . Morphine     REACTION: UNKNOWN REACTION       Follow-up Information    Follow up with [facility].   Contact information:   [address] [place] [address] [phone]        The results of significant diagnostics from this hospitalization (including imaging, microbiology, ancillary and laboratory) are listed below for reference.     Significant Diagnostic Studies: US Venous Img Lower Unilateral Right  11/01/2014   CLINICAL DATA:  Right calf and leg swelling since yesterday. Calf redness and swelling, possible infection. Pain. History of diabetes, shoulder basal cell neoplasm.  EXAM: RIGHT LOWER EXTREMITY VENOUS DOPPLER ULTRASOUND  TECHNIQUE: Gray-scale sonography with compression, as well as color and duplex ultrasound, were performed to evaluate the deep venous system from the level of the common femoral vein through the popliteal and proximal calf veins.  COMPARISON:  None  FINDINGS: Normal compressibility of the common femoral, superficial femoral, and popliteal veins, as well as the proximal calf veins. No filling defects to suggest DVT on grayscale or color Doppler imaging. Doppler waveforms show normal direction of venous flow, normal respiratory phasicity and response to augmentation. Prominent right inguinal lymph node, 7 mm short axis diameter. Subcutaneous edema in the calf. Visualized segments of the saphenous venous system normal in caliber and compressibility. Survey views of the contralateral common femoral vein are unremarkable.  IMPRESSION: 1. No evidence of lower extremity deep vein thrombosis, RIGHT.   Electronically Signed   By: Oley Balm M.D.   On: 11/01/2014 16:14    Microbiology: No results found for this or any previous visit (from the past 240 hour(s)).   Labs: Basic Metabolic Panel: No results for input(s): NA, K, CL, CO2, GLUCOSE, BUN, CREATININE, CALCIUM, MG, PHOS in the last 168 hours. Liver Function Tests: No results for input(s): AST, ALT, ALKPHOS, BILITOT, PROT, ALBUMIN in the last 168 hours. No results for input(s): LIPASE, AMYLASE in the last 168 hours. No results for input(s): AMMONIA in the last 168 hours. CBC: No results for input(s): WBC, NEUTROABS, HGB, HCT, MCV, PLT in the last 168 hours. Cardiac Enzymes: No results for input(s): CKTOTAL, CKMB, CKMBINDEX, TROPONINI in the last  168 hours. BNP: BNP (last 3 results)  Recent Labs  11/01/14 1116  PROBNP 772.9*   CBG: No results for input(s): GLUCAP in the last 168 hours.     Signed:  Emoni Yang Judie Petit  [facility] Pager: [phone] 11/30/2014, 12:48 PM

## 2014-12-19 ENCOUNTER — Ambulatory Visit (HOSPITAL_COMMUNITY)
Admission: RE | Admit: 2014-12-19 | Discharge: 2014-12-19 | Disposition: A | Discharge status: Home / Self Care | Payer: Medicare Other | Source: Ambulatory Visit | Attending: Family Medicine | Admitting: Family Medicine

## 2014-12-19 DIAGNOSIS — Z452 Encounter for adjustment and management of vascular access device: Secondary | ICD-10-CM | POA: Diagnosis not present

## 2014-12-19 MED ORDER — SODIUM CHLORIDE 0.9 % IJ SOLN: 10.0000 mL | INTRAMUSCULAR | Status: DC | PRN

## 2014-12-19 NOTE — Progress Notes (Signed)
PICC line removed easily from pt's right arm, no resistance felt, catheter 43 cm long upon removal. Vaseline gauze and pressure dressing applied. Held pressure for 5 minutes after removal. Instructed pt to leave pressure dressing on for 48 hours.

## 2015-02-23 ENCOUNTER — Encounter: Payer: Self-pay | Admitting: Cardiology

## 2015-02-23 ENCOUNTER — Ambulatory Visit (INDEPENDENT_AMBULATORY_CARE_PROVIDER_SITE_OTHER): Payer: Medicare Other | Admitting: Cardiology

## 2015-02-23 VITALS — BP 122/74 | HR 55 | Ht 61.0 in | Wt 190.0 lb

## 2015-02-23 DIAGNOSIS — R0789 Other chest pain: Secondary | ICD-10-CM | POA: Diagnosis not present

## 2015-02-23 DIAGNOSIS — I10 Essential (primary) hypertension: Secondary | ICD-10-CM | POA: Diagnosis not present

## 2015-02-23 DIAGNOSIS — R600 Localized edema: Secondary | ICD-10-CM | POA: Diagnosis not present

## 2015-02-23 DIAGNOSIS — R002 Palpitations: Secondary | ICD-10-CM | POA: Diagnosis not present

## 2015-02-23 MED ORDER — FUROSEMIDE 40 MG PO TABS: 60.0000 mg | ORAL_TABLET | Freq: Every day | ORAL | Status: DC

## 2015-02-23 MED ORDER — ISOSORBIDE MONONITRATE ER 30 MG PO TB24: 30.0000 mg | ORAL_TABLET | Freq: Every day | ORAL | Status: AC

## 2015-02-23 NOTE — Patient Instructions (Addendum)
Your physician recommends that you schedule a follow-up appointment in: 1 month   INCREASE Lasix to 60 mg daily    DECREASE Imdur to 30 mg daily   Lab work in 2 weeks BMET, Magnesium     Thank you for choosing [facility] !

## 2015-02-23 NOTE — Progress Notes (Signed)
Clinical Summary Ms. Mellette is a 81 y.o.female seen today for follow up of the following medical problems.   1.Non-obstructive CAD:  -cath 2008: LM nl, 10% prox LAD, the mid LAD dipped intramyocardially but no bridging was demonstrated. RCA 30% prox. Left renal 40%  - negative nuclear perfusion scan Sept 2013  - 10/2013 Echo LVEF 60-65%  - has had intermittent chest pain for several years, previously unremarkable caths w/ recent negative stress test.   - no recent chest pain since last visit   2. Palpitations  - prior monitors did not show any significant arrhythmias  - reports some occasional palpitaitons. Continues to drink 4 cups of coffee daily.   3. Dizziness - occurs with standing at times. No syncope  Past Medical History  Diagnosis Date  . GERD (gastroesophageal reflux disease)   . Family hx of colon cancer   . Sphincter of Oddi spasm     Status post ERCP with sphincterotomy for SOD versus papillary stenosis  . Diverticulosis of colon   . Helicobacter pylori gastritis     s/p treatment?  . Gastroparesis 2005    Abnormal GS  . Heart disease, unspecified   . Osteoarthritis   . IBS (irritable bowel syndrome)   . Osteopenia   . Goiter     Dr Emmit Pomfret  . Hypothyroid   . COPD (chronic obstructive pulmonary disease)   . Asthma   . Hypercholesterolemia   . HTN (hypertension)   . DM (diabetes mellitus)   . Fatty liver     Insetting of normal LFTs  . History of adenomatous polyp of colon   . Renal artery stenosis 07/06/12    R renal artery 60-99% diameter reduction PSV 336, EBV 107; >60% narrowing in celiac SMA and IMA  . Pulmonary nodules     Multiple, had previously been followed by CT, stable  . Chest pain     echo 01/29/11 - mild dilation of LA, mild diastolic dysfunction; cath 05/2007 - non-obstructive CAD 10% in LAD , 20-30% in RCA  . Dyspnea 07/30/12    stress test - normal perfusion all regions, EF 67%, no sig change from last study 2012  .  Lower extremity edema 01/07/11    doppler -no evidence of thrombus or insufficiency     Allergies  Allergen Reactions  . Statins Other (See Comments)    myalgias  . Esomeprazole Magnesium     REACTION: rash  . Morphine     REACTION: UNKNOWN REACTION     Current Outpatient Prescriptions  Medication Sig Dispense Refill  . albuterol (PROVENTIL HFA;VENTOLIN HFA) 108 (90 BASE) MCG/ACT inhaler Inhale 2 puffs into the lungs every 6 (six) hours as needed for wheezing or shortness of breath.     Marland Kitchen aspirin 81 MG tablet Take 81 mg by mouth daily.    . cholecalciferol (VITAMIN D) 1000 UNITS tablet Take 2,000 Units by mouth daily.    . furosemide (LASIX) 40 MG tablet Take 1 tablet (40 mg total) by mouth daily as needed for fluid. 60 tablet 6  . glipiZIDE (GLUCOTROL) 5 MG tablet Take 5 mg by mouth 2 (two) times daily before a meal.    . ipratropium-albuterol (DUONEB) 0.5-2.5 (3) MG/3ML SOLN Take 3 mLs by nebulization every 6 (six) hours as needed (WHEEZING/SHORTNESS OF BREATH).     Marland Kitchen isosorbide mononitrate (IMDUR) 30 MG 24 hr tablet TAKE ONE TABLET TWICE DAILY 60 tablet 9  . lansoprazole (PREVACID) 30 MG capsule  Take 1 capsule (30 mg total) by mouth daily. 31 capsule 11  . levothyroxine (SYNTHROID, LEVOTHROID) 75 MCG tablet Take 75 mcg by mouth daily.      Marland Kitchen lisinopril (PRINIVIL,ZESTRIL) 40 MG tablet Take 1 tablet (40 mg total) by mouth daily. 90 tablet 3  . metoprolol succinate (TOPROL-XL) 100 MG 24 hr tablet TAKE ONE MG IN THE AM AND 5OMG IN THE PM (Patient taking differently: Take 50-100 mg by mouth 2 (two) times daily. TAKE 100 MG IN THE AM AND 50 MG IN THE PM) 60 tablet 6  . NITROSTAT 0.4 MG SL tablet Place 0.4 mg under the tongue every 5 (five) minutes as needed for chest pain.     . potassium chloride SA (K-DUR,KLOR-CON) 20 MEQ tablet Take 1 tablet (20 mEq total) by mouth 2 (two) times daily. 30 tablet 0  . tolterodine (DETROL LA) 4 MG 24 hr capsule Take 4 mg by mouth daily.      . traMADol  (ULTRAM) 50 MG tablet Take 50 mg by mouth every 6 (six) hours as needed for moderate pain. Maximum dose= 8 tablets per day    . vancomycin 1,500 mg in sodium chloride 0.9 % 500 mL Inject 1,500 mg into the vein daily. 15000 mg 0   No current facility-administered medications for this visit.     Past Surgical History  Procedure Laterality Date  . Tubal ligation    . Partial hysterectomy    . Cholecystectomy    . Abdominal hernia repair      x 3  . Hip surgery      left  . Ercp w/ sphincterotomy and balloon dilation    . Colonoscopy  05/29/2008    Dr. Jena Gauss diverticulosis, hemorrhoids, 2 hyperplastic polyps  . Cardiac catheterization  05/28/07    normal LV fcn, 30% narrowing in osteum of RCA, 40% narrowig in ostium of LRA     Allergies  Allergen Reactions  . Statins Other (See Comments)    myalgias  . Esomeprazole Magnesium     REACTION: rash  . Morphine     REACTION: UNKNOWN REACTION      Family History  Problem Relation Age of Onset  . Colon cancer Brother 48  . Lung cancer Brother   . Cancer Sister     bladder  . Lung cancer Father      Social History Ms. Carn reports that she has never smoked. She has never used smokeless tobacco. Ms. Gieske reports that she does not drink alcohol.   Review of Systems CONSTITUTIONAL: No weight loss, fever, chills, weakness or fatigue.  HEENT: Eyes: No visual loss, blurred vision, double vision or yellow sclerae.No hearing loss, sneezing, congestion, runny nose or sore throat.  SKIN: No rash or itching.  CARDIOVASCULAR: per HPI RESPIRATORY: No shortness of breath, cough or sputum.  GASTROINTESTINAL: No anorexia, nausea, vomiting or diarrhea. No abdominal pain or blood.  GENITOURINARY: No burning on urination, no polyuria NEUROLOGICAL: occasional dizziness MUSCULOSKELETAL: No muscle, back pain, joint pain or stiffness.  LYMPHATICS: No enlarged nodes. No history of splenectomy.  PSYCHIATRIC: No history of depression or  anxiety.  ENDOCRINOLOGIC: No reports of sweating, cold or heat intolerance. No polyuria or polydipsia.  Marland Kitchen   Physical Examination p 55 bp 122/74 Wt 190 lbs BMI 36 Gen: resting comfortably, no acute distress HEENT: no scleral icterus, pupils equal round and reactive, no palptable cervical adenopathy,  CV: RRR, no m/r/g, no JVD, no carotid bruits Resp: Clear to auscultation  bilaterally GI: abdomen is soft, non-tender, non-distended, normal bowel sounds, no hepatosplenomegaly MSK: extremities are warm, 2+ LE edema Skin: warm, no rash Neuro:  no focal deficits Psych: appropriate affect   Diagnostic Studies  12/2013 Carotid US  IMPRESSION: No significant carotid stenosis identified. Mild amount of plaque present on the right with estimated right ICA stenosis of less than 50%. There is no evidence of left ICA stenosis.  09/2011 CTA Abd/Pelvis  Findings suspicious for an ostial SMA hemodynamically significant stenosis, estimated greater than 50% by CTA.  Mild less than 50% narrowing of the celiac origin.  Patent IMA.  Based on clinical symptoms, consider further evaluation with catheter angiography and possible SMA stent placement.  Previous cholecystectomy and hysterectomy.  Right adrenal nodule, probable adenoma.  Incidental duodenal diverticulum.  Colonic diverticulosis.  Small left ovarian cyst.   10/2013 Echo  LVEF 60-65%, mild LVH, grade I diasotlic dysfunction,   10/2013 Event monitor  No symptoms, no arrhythmias.   12/2013 Holter 48 hrs  NSR, occasional PVCs, no NSVT, no significant arrythmias.   07/2012 MPI No ischemia   Assessment and Plan   1. Non-obstructive CAD:  over 10 years of chest pain w/ negative cardiac caths and stress tests. Likely non-cardiac chest pain, vs coronary spasm or myocardial bridging (though no convinving evidence on cath) - no recent symptoms.  - continue current medications   2. Palpitations  - no significant  arrhythmias from recent testing - counseled to cut back on caffeine  3. Dizziness - occasional orthostatic symptoms, will decrease imdur to 30mg  daily  4. LE edema - increase lasix to 60mg  daily, she will have BMET and Mg in 2 weeks. F/u 1 month       Antoine PocheJonathan F. Jeanmarc Viernes, M.D.

## 2015-03-29 ENCOUNTER — Encounter: Payer: Self-pay | Admitting: Cardiology

## 2015-03-29 ENCOUNTER — Ambulatory Visit (INDEPENDENT_AMBULATORY_CARE_PROVIDER_SITE_OTHER): Payer: Medicare Other | Admitting: Cardiology

## 2015-03-29 VITALS — BP 128/84 | HR 76 | Ht 61.0 in | Wt 193.0 lb

## 2015-03-29 DIAGNOSIS — R600 Localized edema: Secondary | ICD-10-CM

## 2015-03-29 DIAGNOSIS — I10 Essential (primary) hypertension: Secondary | ICD-10-CM

## 2015-03-29 MED ORDER — TORSEMIDE 20 MG PO TABS: 40.0000 mg | ORAL_TABLET | Freq: Every day | ORAL | Status: AC

## 2015-03-29 NOTE — Progress Notes (Signed)
Clinical Summary Angela [name] is a 81 y.o.female seen today for follow up of the following medical problems. This is a focused visit on her recent history of leg swelling.   1. Leg swelling - last visit increased lasix to 60mg  daily due to worsening swelling - since change swelling has not improved - denies any significant SOB   Past Medical History  Diagnosis Date  . GERD (gastroesophageal reflux disease)   . Family hx of colon cancer   . Sphincter of Oddi spasm     Status post ERCP with sphincterotomy for SOD versus papillary stenosis  . Diverticulosis of colon   . Helicobacter pylori gastritis     s/p treatment?  . Gastroparesis 2005    Abnormal GS  . Heart disease, unspecified   . Osteoarthritis   . IBS (irritable bowel syndrome)   . Osteopenia   . Goiter     Dr Emmit Pomfretarol Wolicki  . Hypothyroid   . COPD (chronic obstructive pulmonary disease)   . Asthma   . Hypercholesterolemia   . HTN (hypertension)   . DM (diabetes mellitus)   . Fatty liver     Insetting of normal LFTs  . History of adenomatous polyp of colon   . Renal artery stenosis 07/06/12    R renal artery 60-99% diameter reduction PSV 336, EBV 107; >60% narrowing in celiac SMA and IMA  . Pulmonary nodules     Multiple, had previously been followed by CT, stable  . Chest pain     echo 01/29/11 - mild dilation of LA, mild diastolic dysfunction; cath 05/2007 - non-obstructive CAD 10% in LAD , 20-30% in RCA  . Dyspnea 07/30/12    stress test - normal perfusion all regions, EF 67%, no sig change from last study 2012  . Lower extremity edema 01/07/11    doppler -no evidence of thrombus or insufficiency     Allergies  Allergen Reactions  . Statins Other (See Comments)    myalgias  . Esomeprazole Magnesium     REACTION: rash  . Morphine     REACTION: UNKNOWN REACTION     Current Outpatient Prescriptions  Medication Sig Dispense Refill  . albuterol (PROVENTIL HFA;VENTOLIN HFA) 108 (90 BASE) MCG/ACT  inhaler Inhale 2 puffs into the lungs every 6 (six) hours as needed for wheezing or shortness of breath.     Marland Kitchen. aspirin 81 MG tablet Take 81 mg by mouth daily.    . cephALEXin (KEFLEX) 500 MG capsule     . cholecalciferol (VITAMIN D) 1000 UNITS tablet Take 2,000 Units by mouth daily.    . furosemide (LASIX) 40 MG tablet Take 1.5 tablets (60 mg total) by mouth daily. 135 tablet 6  . glipiZIDE (GLUCOTROL) 5 MG tablet Take 5 mg by mouth 2 (two) times daily before a meal.    . ipratropium-albuterol (DUONEB) 0.5-2.5 (3) MG/3ML SOLN Take 3 mLs by nebulization every 6 (six) hours as needed (WHEEZING/SHORTNESS OF BREATH).     Marland Kitchen. isosorbide mononitrate (IMDUR) 30 MG 24 hr tablet Take 1 tablet (30 mg total) by mouth daily. 60 tablet 9  . lansoprazole (PREVACID) 30 MG capsule Take 1 capsule (30 mg total) by mouth daily. 31 capsule 11  . levothyroxine (SYNTHROID, LEVOTHROID) 75 MCG tablet Take 75 mcg by mouth daily.      Marland Kitchen. lisinopril (PRINIVIL,ZESTRIL) 40 MG tablet Take 1 tablet (40 mg total) by mouth daily. 90 tablet 3  . metoprolol succinate (TOPROL-XL) 100 MG 24 hr  tablet TAKE ONE MG IN THE AM AND 5OMG IN THE PM (Patient taking differently: Take 50-100 mg by mouth 2 (two) times daily. TAKE 100 MG IN THE AM AND 50 MG IN THE PM) 60 tablet 6  . NITROSTAT 0.4 MG SL tablet Place 0.4 mg under the tongue every 5 (five) minutes as needed for chest pain.     . potassium chloride SA (K-DUR,KLOR-CON) 20 MEQ tablet Take 1 tablet (20 mEq total) by mouth 2 (two) times daily. 30 tablet 0  . tolterodine (DETROL LA) 4 MG 24 hr capsule Take 4 mg by mouth daily.      . traMADol (ULTRAM) 50 MG tablet Take 50 mg by mouth every 6 (six) hours as needed for moderate pain. Maximum dose= 8 tablets per day     No current facility-administered medications for this visit.     Past Surgical History  Procedure Laterality Date  . Tubal ligation    . Partial hysterectomy    . Cholecystectomy    . Abdominal hernia repair      x 3  .  Hip surgery      left  . Ercp w/ sphincterotomy and balloon dilation    . Colonoscopy  05/29/2008    Dr. Jena Gauss diverticulosis, hemorrhoids, 2 hyperplastic polyps  . Cardiac catheterization  05/28/07    normal LV fcn, 30% narrowing in osteum of RCA, 40% narrowig in ostium of LRA     Allergies  Allergen Reactions  . Statins Other (See Comments)    myalgias  . Esomeprazole Magnesium     REACTION: rash  . Morphine     REACTION: UNKNOWN REACTION      Family History  Problem Relation Age of Onset  . Colon cancer Brother 67  . Lung cancer Brother   . Cancer Sister     bladder  . Lung cancer Father      Social History Ms. Gorder reports that she has never smoked. She has never used smokeless tobacco. Ms. Scaff reports that she does not drink alcohol.   Review of Systems CONSTITUTIONAL: No weight loss, fever, chills, weakness or fatigue.  HEENT: Eyes: No visual loss, blurred vision, double vision or yellow sclerae.No hearing loss, sneezing, congestion, runny nose or sore throat.  SKIN: No rash or itching.  CARDIOVASCULAR: per HPI RESPIRATORY: No shortness of breath, cough or sputum.  GASTROINTESTINAL: No anorexia, nausea, vomiting or diarrhea. No abdominal pain or blood.  GENITOURINARY: No burning on urination, no polyuria NEUROLOGICAL: No headache, dizziness, syncope, paralysis, ataxia, numbness or tingling in the extremities. No change in bowel or bladder control.  MUSCULOSKELETAL: No muscle, back pain, joint pain or stiffness.  LYMPHATICS: No enlarged nodes. No history of splenectomy.  PSYCHIATRIC: No history of depression or anxiety.  ENDOCRINOLOGIC: No reports of sweating, cold or heat intolerance. No polyuria or polydipsia.  Marland Kitchen   Physical Examination Filed Vitals:   03/29/15 1020  BP: 128/84  Pulse: 76   Filed Vitals:   03/29/15 1020  Height:  (1.549 m)  Weight: 193 lb (87.544 kg)    Gen: resting comfortably, no acute distress HEENT: no scleral  icterus, pupils equal round and reactive, no palptable cervical adenopathy,  CV: RRR, no m/r/g, no JVD Resp: Clear to auscultation bilaterally GI: abdomen is soft, non-tender, non-distended, normal bowel sounds, no hepatosplenomegaly MSK: extremities are warm, 2+bilateral LE edema Skin: warm, no rash Neuro:  no focal deficits Psych: appropriate affect   Diagnostic Studies 12/2013 Carotid US  IMPRESSION: No significant carotid stenosis identified. Mild amount of plaque present on the right with estimated right ICA stenosis of less than 50%. There is no evidence of left ICA stenosis.  09/2011 CTA Abd/Pelvis  Findings suspicious for an ostial SMA hemodynamically significant stenosis, estimated greater than 50% by CTA.  Mild less than 50% narrowing of the celiac origin.  Patent IMA.  Based on clinical symptoms, consider further evaluation with catheter angiography and possible SMA stent placement.  Previous cholecystectomy and hysterectomy.  Right adrenal nodule, probable adenoma.  Incidental duodenal diverticulum.  Colonic diverticulosis.  Small left ovarian cyst.   10/2013 Echo  LVEF 60-65%, mild LVH, grade I diasotlic dysfunction,   10/2013 Event monitor  No symptoms, no arrhythmias.   12/2013 Holter 48 hrs  NSR, occasional PVCs, no NSVT, no significant arrythmias.   07/2012 MPI No ischemia      Assessment and Plan   . LE edema - change lasix to torsemide 40mg  daily - repeat BMET and Mg in 2 weeks   F/u 2 weeks  Antoine PocheJonathan F. Mildreth Reek, M.D.

## 2015-03-29 NOTE — Patient Instructions (Addendum)
Your physician recommends that you schedule a follow-up appointment in:  2 weeks with Joni ReiningKathryn Lawrence NP    STOP Lasix  START Torsemide 40 mg daily   Lab work in 2 weeks 04/12/15      Thank you for choosing [facility] !

## 2015-04-12 ENCOUNTER — Ambulatory Visit: Payer: Medicare Other | Admitting: Adult Health

## 2015-07-19 DEATH — deceased

## 2016-03-07 IMAGING — US US EXTREM LOW VENOUS*R*
1 series · 14 of 24 positions shown · non-contrast
Comparison: None

CLINICAL DATA: Right calf and leg swelling since yesterday. Calf
redness and swelling, possible infection. Pain. History of diabetes,
shoulder basal cell neoplasm.

EXAM:
RIGHT LOWER EXTREMITY VENOUS DOPPLER ULTRASOUND
TECHNIQUE: Gray-scale sonography with compression, as well as color and duplex
ultrasound, were performed to evaluate the deep venous system from
the level of the common femoral vein through the popliteal and
proximal calf veins.

[Series 1: us extrem low venous*right* · 0.08mm/px · 14 of 55 slices shown]
[im 1/55]
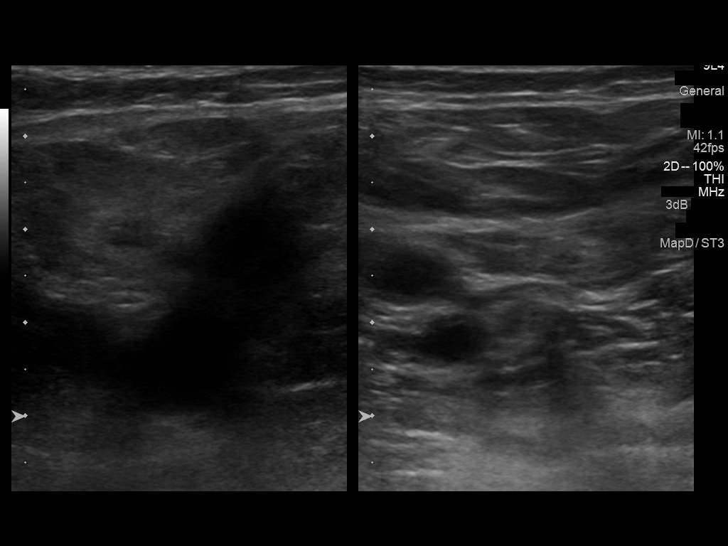
[im 5/55]
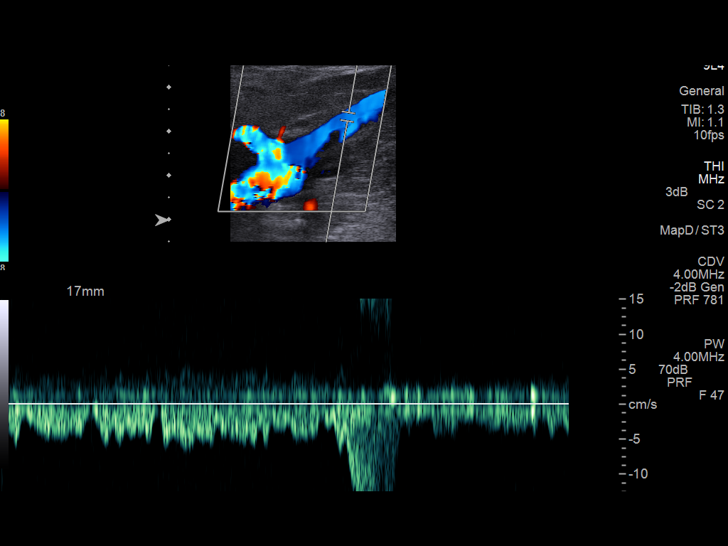
[im 10/55]
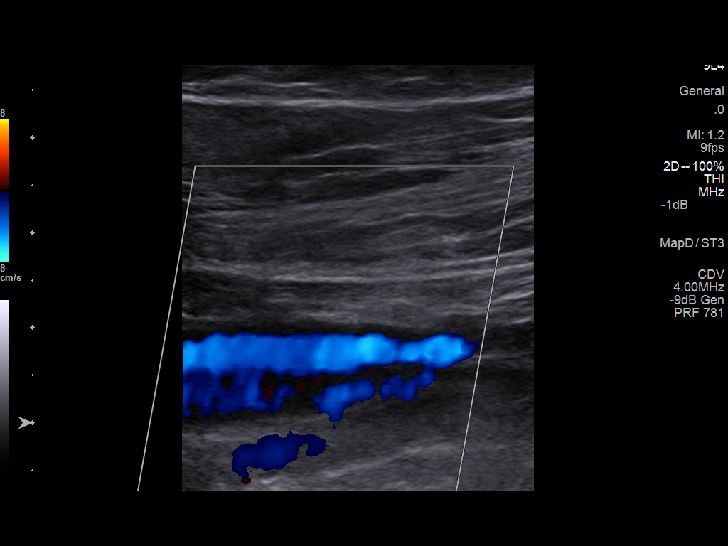
[im 15/55]
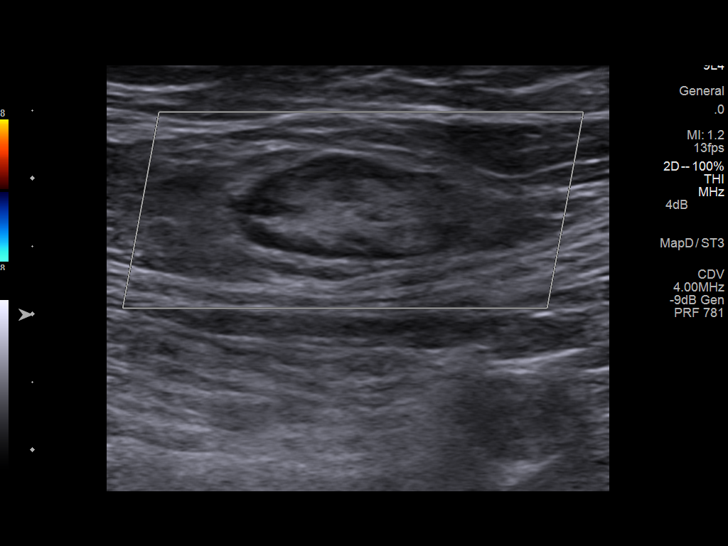
[im 17/55]
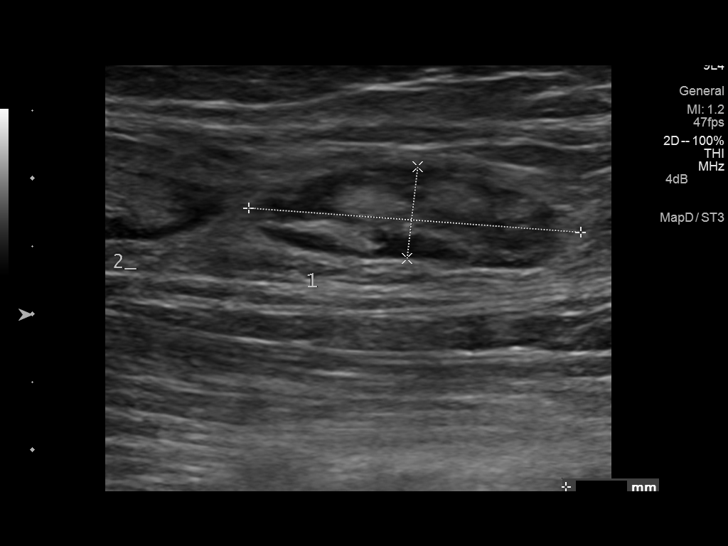
[im 22/55]
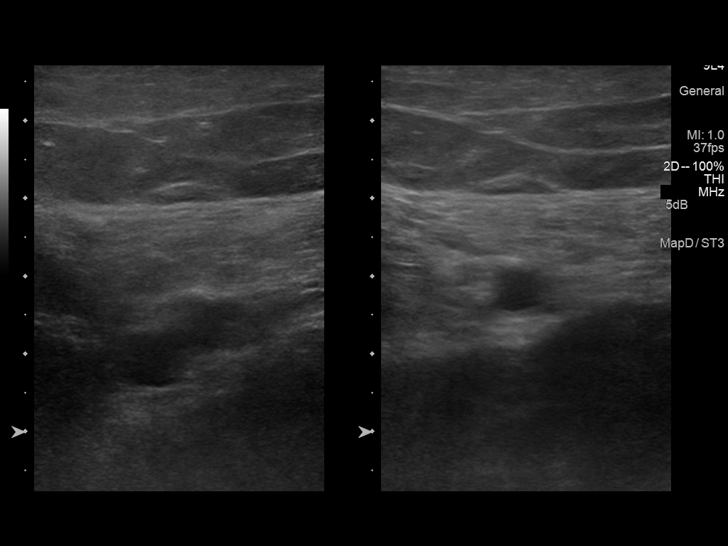
[im 26/55]
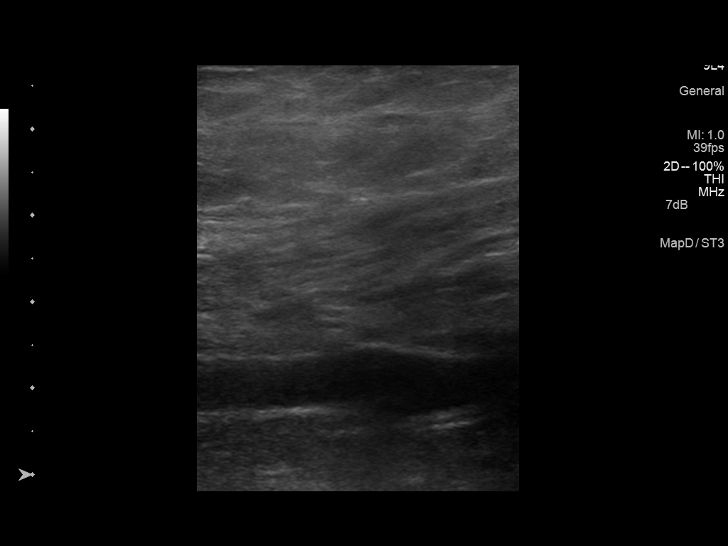
[im 29/55]
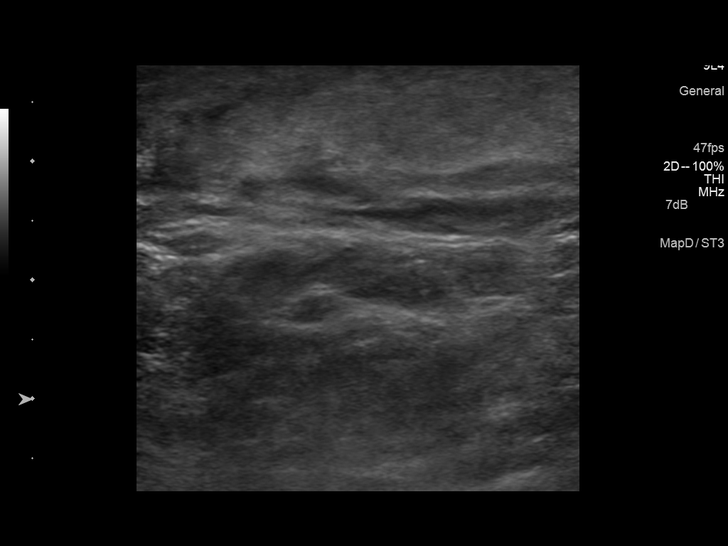
[im 33/55]
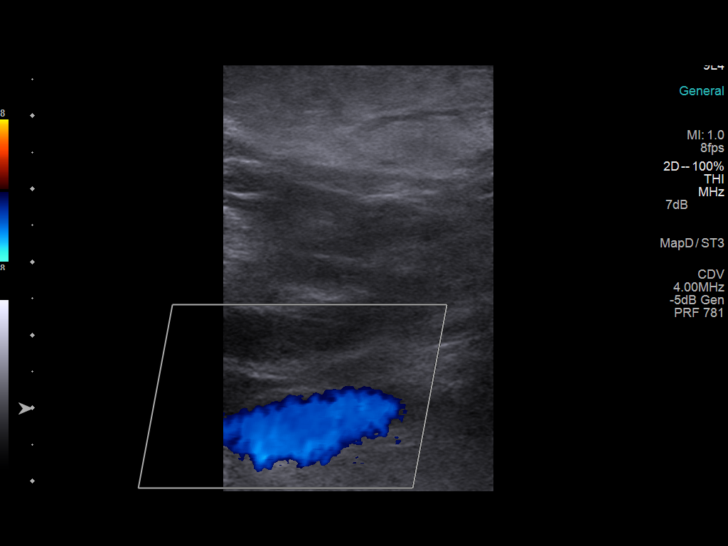
[im 38/55]
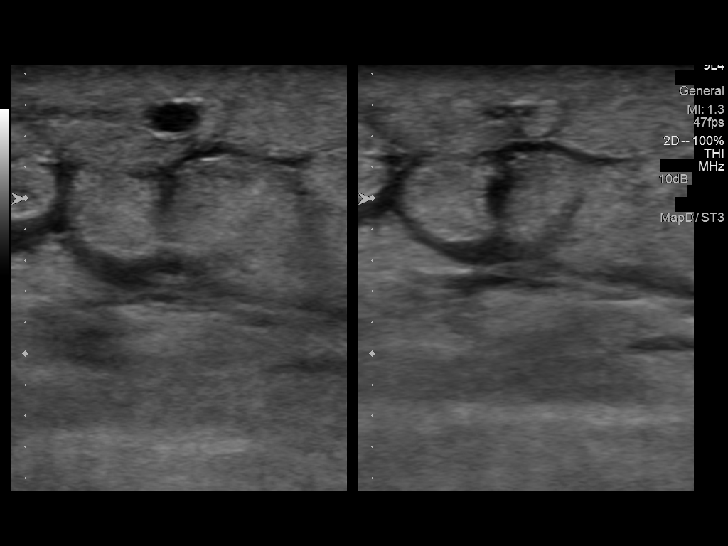
[im 43/55]
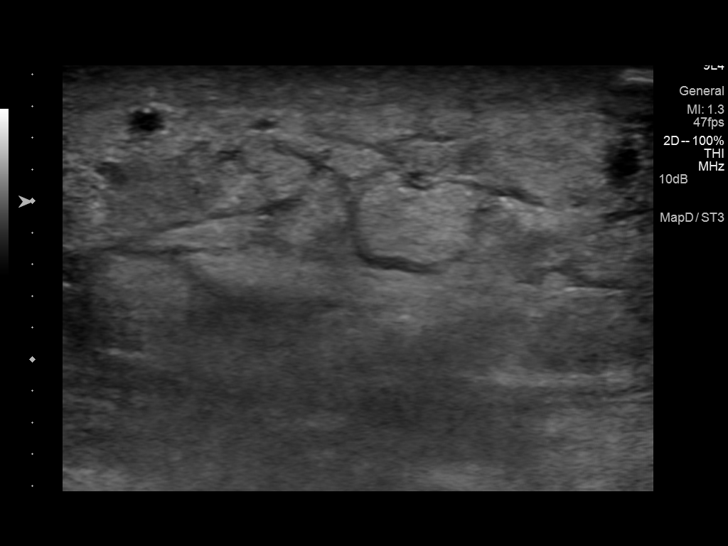
[im 45/55]
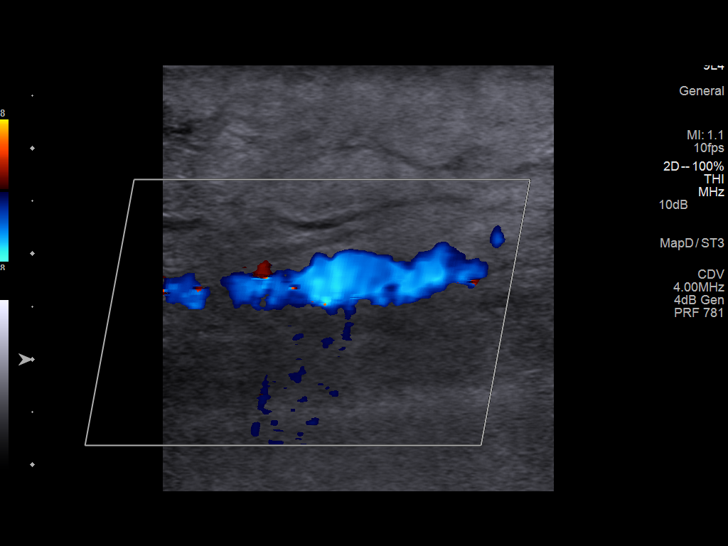
[im 50/55]
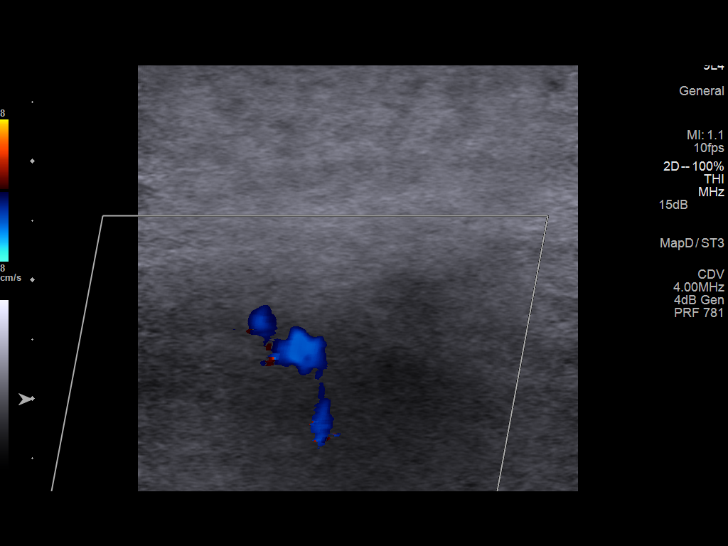
[im 55/55]
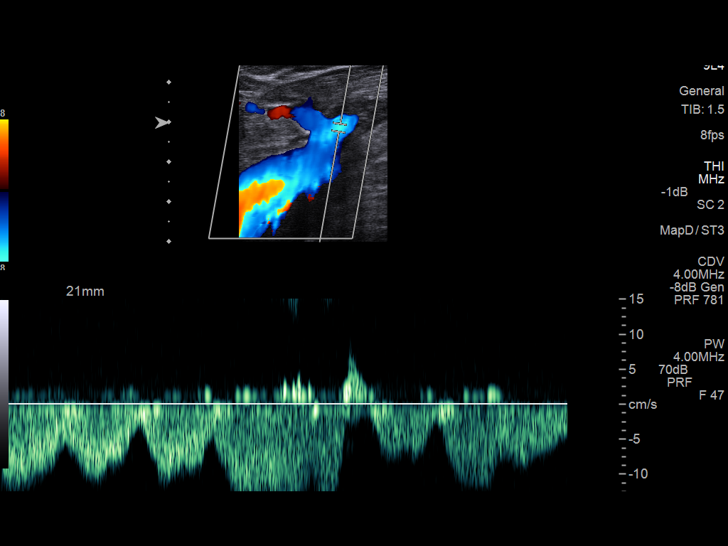

[14 of 24 positions shown; findings below may reference images not displayed]

FINDINGS: Normal compressibility of the common femoral, superficial femoral,
and popliteal veins, as well as the proximal calf veins. No filling
defects to suggest DVT on grayscale or color Doppler imaging.
Doppler waveforms show normal direction of venous flow, normal
respiratory phasicity and response to augmentation. Prominent right
inguinal lymph node, 7 mm short axis diameter. Subcutaneous edema in
the calf. Visualized segments of the saphenous venous system normal
in caliber and compressibility. Survey views of the contralateral
common femoral vein are unremarkable.
IMPRESSION: 1. No evidence of lower extremity deep vein thrombosis, RIGHT.

## 2016-08-14 NOTE — Progress Notes (Signed)
This encounter was created in error - please disregard.
